# Patient Record
Sex: Male | Born: 1935 | Race: White | Hispanic: No | Marital: Married | Smoking: Former smoker
Health system: Southern US, Community
[De-identification: ages and names within clinical notes are randomized; demographics above are authoritative.]

## PROBLEM LIST (undated history)

## (undated) DIAGNOSIS — I671 Cerebral aneurysm, nonruptured: Secondary | ICD-10-CM

## (undated) DIAGNOSIS — N189 Chronic kidney disease, unspecified: Secondary | ICD-10-CM

## (undated) DIAGNOSIS — I639 Cerebral infarction, unspecified: Secondary | ICD-10-CM

## (undated) DIAGNOSIS — D649 Anemia, unspecified: Secondary | ICD-10-CM

## (undated) DIAGNOSIS — E785 Hyperlipidemia, unspecified: Secondary | ICD-10-CM

## (undated) DIAGNOSIS — J189 Pneumonia, unspecified organism: Secondary | ICD-10-CM

## (undated) DIAGNOSIS — I714 Abdominal aortic aneurysm, without rupture: Secondary | ICD-10-CM

## (undated) DIAGNOSIS — J449 Chronic obstructive pulmonary disease, unspecified: Secondary | ICD-10-CM

## (undated) DIAGNOSIS — I10 Essential (primary) hypertension: Secondary | ICD-10-CM

## (undated) HISTORY — DX: Cerebral infarction, unspecified: I63.9

## (undated) HISTORY — DX: Chronic obstructive pulmonary disease, unspecified: J44.9

## (undated) HISTORY — DX: Pneumonia, unspecified organism: J18.9

## (undated) HISTORY — DX: Chronic kidney disease, unspecified: N18.9

## (undated) HISTORY — DX: Anemia, unspecified: D64.9

## (undated) HISTORY — DX: Abdominal aortic aneurysm, without rupture: I71.4

## (undated) HISTORY — DX: Essential (primary) hypertension: I10

## (undated) HISTORY — DX: Hyperlipidemia, unspecified: E78.5

---

## 1970-05-21 DIAGNOSIS — I639 Cerebral infarction, unspecified: Secondary | ICD-10-CM

## 1970-05-21 HISTORY — PX: CARDIAC CATHETERIZATION: SHX172

## 1970-05-21 HISTORY — DX: Cerebral infarction, unspecified: I63.9

## 2001-03-13 ENCOUNTER — Ambulatory Visit (HOSPITAL_COMMUNITY): Admission: RE | Admit: 2001-03-13 | Discharge: 2001-03-13 | Payer: Self-pay | Admitting: Pulmonary Disease

## 2001-04-10 ENCOUNTER — Ambulatory Visit (HOSPITAL_COMMUNITY): Admission: RE | Admit: 2001-04-10 | Discharge: 2001-04-10 | Payer: Self-pay | Admitting: Cardiology

## 2003-08-19 ENCOUNTER — Ambulatory Visit (HOSPITAL_COMMUNITY): Admission: RE | Admit: 2003-08-19 | Discharge: 2003-08-19 | Payer: Self-pay | Admitting: Family Medicine

## 2005-10-12 ENCOUNTER — Ambulatory Visit: Payer: Self-pay | Admitting: Internal Medicine

## 2005-10-23 ENCOUNTER — Ambulatory Visit: Payer: Self-pay | Admitting: Internal Medicine

## 2005-10-23 ENCOUNTER — Ambulatory Visit (HOSPITAL_COMMUNITY): Admission: RE | Admit: 2005-10-23 | Discharge: 2005-10-23 | Payer: Self-pay | Admitting: Internal Medicine

## 2005-12-13 ENCOUNTER — Inpatient Hospital Stay (HOSPITAL_COMMUNITY): Admission: EM | Admit: 2005-12-13 | Discharge: 2005-12-20 | Payer: Self-pay | Admitting: Emergency Medicine

## 2005-12-28 ENCOUNTER — Ambulatory Visit (HOSPITAL_COMMUNITY): Admission: RE | Admit: 2005-12-28 | Discharge: 2005-12-28 | Payer: Self-pay | Admitting: Family Medicine

## 2006-07-26 ENCOUNTER — Ambulatory Visit (HOSPITAL_COMMUNITY): Admission: RE | Admit: 2006-07-26 | Discharge: 2006-07-26 | Payer: Self-pay | Admitting: Family Medicine

## 2007-02-11 ENCOUNTER — Ambulatory Visit: Payer: Self-pay | Admitting: Vascular Surgery

## 2007-02-11 DIAGNOSIS — I714 Abdominal aortic aneurysm, without rupture, unspecified: Secondary | ICD-10-CM

## 2007-02-11 HISTORY — DX: Abdominal aortic aneurysm, without rupture, unspecified: I71.40

## 2007-02-11 HISTORY — DX: Abdominal aortic aneurysm, without rupture: I71.4

## 2008-02-16 ENCOUNTER — Ambulatory Visit: Payer: Self-pay | Admitting: Vascular Surgery

## 2008-08-17 ENCOUNTER — Ambulatory Visit: Payer: Self-pay | Admitting: Vascular Surgery

## 2009-01-25 ENCOUNTER — Ambulatory Visit: Payer: Self-pay | Admitting: Vascular Surgery

## 2009-08-02 ENCOUNTER — Ambulatory Visit: Payer: Self-pay | Admitting: Vascular Surgery

## 2010-02-07 ENCOUNTER — Encounter: Payer: Self-pay | Admitting: Internal Medicine

## 2010-02-28 ENCOUNTER — Ambulatory Visit: Payer: Self-pay | Admitting: Vascular Surgery

## 2010-10-03 NOTE — Procedures (Signed)
DUPLEX ULTRASOUND OF ABDOMINAL AORTA   INDICATION:  Follow up abdominal aortic aneurysm.   HISTORY:  Diabetes:  Yes.  Cardiac:  No.  Hypertension:  Yes.  Smoking:  Cigars intermittently.  Connective Tissue Disorder:  Family History:  No.  Previous Surgery:  No.   DUPLEX EXAM:         AP (cm)                   TRANSVERSE (cm)  Proximal             2.54 Cm                   2.90 cm  Mid                  4.35 cm                   4.24 cm  Distal               3.54 cm  Right Iliac          1.33 cm  Left Iliac           1.37 cm   PREVIOUS:  Date: 02/11/07  AP:  4.13  TRANSVERSE:  4.1   IMPRESSION:  1. Stable abdominal aortic aneurysm with largest measurement of 4.35      cm X 4.24 cm.  2. Some areas suboptimally imaged due to body habitus, bowel gas, and      technical limitations.   ___________________________________________  Quita Skye Hart [place], M.D.   AS/MEDQ  D:  02/16/2008  T:  02/16/2008  Job:  [id number]

## 2010-10-03 NOTE — Procedures (Signed)
DUPLEX ULTRASOUND OF ABDOMINAL AORTA   INDICATION:  Follow-up evaluation of known abdominal aortic aneurysm.   HISTORY:  Diabetes:  Yes  Cardiac:  No  Hypertension:  Yes  Smoking:  The patient smokes cigars  Connective Tissue Disorder:  Family History:  No  Previous Surgery:  No   DUPLEX EXAM:         AP (cm)                   TRANSVERSE (cm)  Proximal             2.0 cm                    2.4 cm  Mid                  4.4 cm                    4.4 cm  Distal               3.3 cm                    3.4 cm  Right Iliac          1.0 cm                    1.3 cm  Left Iliac           1.1 cm                    1.3 cm   PREVIOUS:  Date: February 16, 2008  AP:  4.4  TRANSVERSE:  4.2   IMPRESSION:  Abdominal aortic aneurysm.  Measurements are stable  compared to previous studies.   ___________________________________________  Quita Skye Hart [place], M.D.   MC/MEDQ  D:  08/17/2008  T:  08/17/2008  Job:  [id number]

## 2010-10-03 NOTE — Procedures (Signed)
DUPLEX ULTRASOUND OF ABDOMINAL AORTA   INDICATION:  Followup of abdominal aortic aneurysm.   HISTORY:  Diabetes:  Yes.  Cardiac:  No.  Hypertension:  Yes.  Smoking:  Yes.  Connective Tissue Disorder:  Family History:  No.  Previous Surgery:  No.   DUPLEX EXAM:         AP (cm)                   TRANSVERSE (cm)  Proximal             4.27 cm                   4.04 cm  Mid                  2.2 cm                    2.05 cm  Distal               1.72 cm                   2.3 cm  Right Iliac          0.94 cm                   1.15 cm  Left Iliac           0.63 cm                   1.33 cm   PREVIOUS:  Date: 01/25/09  AP:  4.3  TRANSVERSE:  4.3   IMPRESSION:  1. Abdominal aortic aneurysm, the largest measurement measuring 4.27 X      4.04 cm.  2. Poor visualization due to the patient's body habitus, bowel gas      pattern, and shadowing from gallstones.   ___________________________________________  Quita Skye Hart [place], M.D.   CJ/MEDQ  D:  08/02/2009  T:  08/02/2009  Job:  [id number]

## 2010-10-03 NOTE — Consult Note (Signed)
NEW PATIENT CONSULTATION   Dylan [name], Dylan [name]  DOB:  09/03/1930                                       02/28/2010  ZOXWR#:[id number]   Patient is a 77 year old male patient of Dr. Butch Penny with  abdominal aortic aneurysm which we have followed for several years.  He  denies any recent abdominal or back symptoms.   CHRONIC MEDICAL PROBLEMS:  1. Diabetes mellitus type 2.  2. Hypertension.  3. Hyperlipidemia.  4. History of cholelithiasis which has resolved, apparently.   SOCIAL HISTORY:  The patient is married, has 5 children, is retired.  Has not smoked in 40 years.  Does not use alcohol.   FAMILY HISTORY:  Positive for coronary artery disease in his brother.  Negative for diabetes and stroke.   REVIEW OF SYSTEMS:  Positive for changes in his vision, some leg  discomfort with walking, although he is able to ambulate up to 1 mile.  Diffuse arthritis.  Denies all other symptoms in review of systems.   PHYSICAL EXAMINATION:  Blood pressure 181/88, heart rate 71,  respirations 14.  Generally, he is an obese, well-developed and well-  nourished male who is in no apparent distress, alert and oriented x3.  HEENT:  Normal for age.  He is edentulous.  EOMs intact.  Lungs:  Clear  to auscultation.  No rhonchi or wheezing.  Cardiovascular:  Regular  rhythm.  No murmurs.  Carotid pulses are 3+.  No audible bruits.  Abdomen:  Obese.  No palpable masses are noted.  Musculoskeletal:  Free  of major deformities.  Neurologic:  Normal.  Skin is free of rashes.  Lower extremity exam reveals 3+ femoral, popliteal, and posterior tibial  pulses palpable bilaterally.  No edema.   Today I ordered a duplex scan of his abdominal aorta for continued  follow-up.  His aneurysm remains in the 4.2 to 4.3 cm range in maximum  diameter, which has not changed significantly in the last 4 years.   I discussed these findings with him and the fact that we do need to  watch this on an annual  basis to look for silent enlargements.  We will  see him in 1 year with a repeat duplex scan of his aorta.  If he should  develop any severe back, abdominal, or flank symptoms, he will report to  the emergency room.     Quita Skye Hart [place], M.D.  Electronically Signed   JDL/MEDQ  D:  02/28/2010  T:  03/01/2010  Job:  [id number]

## 2010-10-03 NOTE — Procedures (Signed)
DUPLEX ULTRASOUND OF ABDOMINAL AORTA   INDICATION:  Follow up of abdominal aortic aneurysm.   HISTORY:  Diabetes:  Yes.  Cardiac:  No.  Hypertension:  Yes.  Smoking:  Yes.  Connective Tissue Disorder:  Family History:  No.  Previous Surgery:  No.   DUPLEX EXAM:         AP (cm)                   TRANSVERSE (cm)  Proximal             2.6 Cm                    2.0 cm  Mid                  4.2 cm                    4.1 Cm  Distal               3.8 cm                    3.7 Cm  Right Iliac          1.7 cm                    1.7 Cm  Left Iliac           2.3 cm                    1.4 Cm   PREVIOUS:  Date:  AP:  4.3  TRANSVERSE:  4.3 cm   IMPRESSION:  1. Stable abdominal aortic aneurysm within the mid aorta measuring 4.2      x 4.1 cm, unchanged from previous ultrasound dated 08/02/2009.  2. Technically study due to the patient's body habitus.   ___________________________________________  Quita Skye Hart [place], M.D.   OD/MEDQ  D:  02/28/2010  T:  02/28/2010  Job:  [id number]

## 2010-10-03 NOTE — Procedures (Signed)
DUPLEX ULTRASOUND OF ABDOMINAL AORTA   INDICATION:  Follow up abdominal aortic aneurysm.   HISTORY:  Diabetes:  Yes.  Cardiac:  No.  Hypertension:  Yes.  Smoking:  Cigars.  Connective Tissue Disorder:  Family History:  No.  Previous Surgery:  No.   DUPLEX EXAM:         AP (cm)                   TRANSVERSE (cm)  Proximal             2.26 Cm                   2.27 cm  Mid                  4.13 cm                   4.1 Cm  Distal               1.68 cm                   1.85 cm  Right Iliac          1.22 cm                   1.33 cm  Left Iliac           1.19 cm                   1.37 cm   PREVIOUS:  Date: 2000, by CT  AP:  4.1  TRANSVERSE:   IMPRESSION:  Stable abdominal aortic aneurysm measurements from previous  CT.   ___________________________________________  Quita Skye. Hart [place], M.D.   AS/MEDQ  D:  02/11/2007  T:  02/11/2007  Job:  [id number]

## 2010-10-03 NOTE — Procedures (Signed)
DUPLEX ULTRASOUND OF ABDOMINAL AORTA   INDICATION:  Abdominal aortic aneurysm.   HISTORY:  Diabetes:  Yes.  Cardiac:  No.  Hypertension:  Yes.  Smoking:  Yes.  Connective Tissue Disorder:  Family History:  No.  Previous Surgery:  No.   DUPLEX EXAM:         AP (cm)                   TRANSVERSE (cm)  Proximal             2.6 cm                    2.7 cm  Mid                  4.3 cm                    4.3 cm  Distal               not visualized            not visualized  Right Iliac          not visualized            not visualized  Left Iliac           not visualized            not visualized   PREVIOUS:  Date:  AP:  TRANSVERSE:   IMPRESSION:  1. Stable measurements of the abdominal aortic aneurysm noted when      compared to the previous exam, as described above.  2. Limited visualization of the abdominal aorta noted due to overlying      bowel gas patterns.   ___________________________________________  Quita Skye Hart [place], M.D.   CH/MEDQ  D:  01/25/2009  T:  01/25/2009  Job:  [id number]

## 2010-10-06 NOTE — Group Therapy Note (Signed)
Dylan [name], [name]                ACCOUNT NO.:  [id number]   MEDICAL RECORD NO.:  [id number]          PATIENT TYPE:  INP   LOCATION:  A314                          FACILITY:  [facility]   PHYSICIAN:  Mila Homer. Sudie Bailey, M.D.DATE OF BIRTH:  [birth year]   DATE OF PROCEDURE:  12/16/2005  DATE OF DISCHARGE:                                   PROGRESS NOTE   SUBJECTIVE:  The patient was admitted with pneumonia. He says he feels a  good deal better.   OBJECTIVE:  VITAL SIGNS:  Temperature 98.6, pulse 73, respiratory rate 18,  blood pressure 133/64.  GENERAL:  He is sitting up in bed, eating lunch. He is oriented, alert, well-  developed, and obese.  SKIN:  His color is good.  LUNGS:  Show decreased breath sounds throughout but he is moving air well.  There are no intercostal retractions. No use of accessory muscles on  respiration.  HEART:  Regular rhythm with rate of about 70.  ABDOMEN:  Soft without hepatosplenomegaly, mass, or tenderness.  EXTREMITIES:  There is no edema in the ankles.   ASSESSMENT:  1. Lingular pneumonia.  2. Dehydration.  3. Type 2 diabetes.  4. Hypercholesterolemia.  5. Central hypertension.   PLAN:  Continue IV Azithromycin and Rocephin. Continue Metformin 1,000 mg  b.i.d., Glipizide 10 mg b.i.d., and NovoLog insulin t.i.d. for his diabetes.  Continue the Losartan 50 mg daily and HCTZ 12.5 mg daily for hypertension  and renal protection. Continue ASA 81 mg daily with Atorvastatin 40 mg daily  and Pantoprazole 40 mg daily. Chest x-ray results from today are pending.      Mila Homer. Sudie Bailey, M.D.  Electronically Signed     SDK/MEDQ  D:  12/16/2005  T:  12/16/2005  Job:  [id number]

## 2010-10-06 NOTE — Group Therapy Note (Signed)
NAME:  Dylan [name] NO.:  [id number]   MEDICAL RECORD NO.:  [id number]          PATIENT TYPE:  INP   LOCATION:  A314                          FACILITY:  [facility]   PHYSICIAN:  Catalina Pizza, M.D.        DATE OF BIRTH:  01-18-1931   DATE OF PROCEDURE:  DATE OF DISCHARGE:  12/15/2005                                   PROGRESS NOTE   SUBJECTIVE:  Dylan [name] is a 77 year old white gentleman, who has diabetes  mellitus, who presented with fever and worsening confusion felt secondary to  a pneumonia seen on chest x-ray.  He was started on Rocephin and Zithromax  IV and continued having some confusion initially but per his family today,  states that he has been much more normal as far as his mentation.  He denies  any specific chest pains or shortness of breath.  No other difficulties  other than the food that he is given.   OBJECTIVE:  VITAL SIGNS:  T-max was 101.3 last evening at 8:00 but has been  98.3 through today.  Blood pressure 130/89, pulse 65, respirations 20.  CBGs  have been 94, 102, and 167, respectively.  He is saturating 95% on room air.  GENERAL:  This is an elderly male sitting in chair in no acute distress.  HEENT:  Unremarkable.  LUNGS:  Good air movement throughout.  No rhonchi or wheezing, and no  consolidation appreciated.  HEART:  Regular rate and rhythm.  No murmurs, gallops, or rubs.  ABDOMEN:  Soft, nondistended, and positive bowel sounds.  EXTREMITIES:  No lower extremity edema.  NEUROLOGIC:  Alert and oriented x3, much more appropriate, and responding to  questions appropriately.   Blood cultures x2 to date are negative.  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   IMPRESSION:  This is a 77 year old gentleman admitted for pneumonia and  altered mental status.   ASSESSMENT/PLAN:  1.  Bronchial pneumonia with left lingular lobe infiltrate.  We will      continue with the IV Rocephin and Zithromax at this time since he was      febrile last evening and continue to monitor.  We will repeat PA and      lateral chest x-ray in the morning to see if any change in this      infiltrate.  2.  Mild dehydration.  The patient has been repleted with fluid and is doing      well from that standpoint.  He is eating and drinking normally at this      time.  3.  Altered mental status/delirium.  This may have been secondary to the      infection.  He does have history of old infarcts and unclear exact      baseline but appears to be more appropriate today and family has not      noticed any  abnormalities hallucinations or behavior.  4.  Diabetes mellitus, type 2.  The patient is on NovoLog sliding-scale at      this time.  His Glucophage is continued twice a day, and his Glucotrol      was supposedly on hold, unclear if he has received it today.  This is on      hold secondary to he was having low blood sugars with decreased      appetite.  As this increases, may want to resume this medicine.  5.  Gastrointestinal and deep venous thrombosis prophylaxis with Lovenox and      Protonix.   DISPOSITION:  We will continue with the intravenous antibiotics for at least  another 1-2 days.  If the patient is better, may be able to be discharged on  Monday or Tuesday.      Catalina Pizza, M.D.  Electronically Signed     ZH/MEDQ  D:  12/15/2005  T:  12/15/2005  Job:  [id number]

## 2010-10-06 NOTE — Op Note (Signed)
NAME:  Dylan [name], Dylan [name]                ACCOUNT NO.:  [id number]   MEDICAL RECORD NO.:  [id number]          PATIENT TYPE:  AMB   LOCATION:  DAY                           FACILITY:  [facility]   PHYSICIAN:  Lionel December, M.D.    DATE OF BIRTH:  [birth year]   DATE OF PROCEDURE:  10/23/2005  DATE OF DISCHARGE:                                 OPERATIVE REPORT   PROCEDURE:  Esophagogastroduodenoscopy followed by colonoscopy.   INDICATION:  Mr. Hoffmann is 77 year old Caucasian male who recently  presented to Dr. Renard Matter with history of melena and was noted to be anemic  with a hemoglobin of 10.5.  By the time he was seen in the office, his  melena had resolved and his stool was guaiac negative.  His hemoglobin has  come up from 10.5 to 12.2 ten days ago.  He denies abdominal pain or rectal  bleeding but has nausea and early satiety, but has not lost any weight.  He  is on PPI for GERD.  He is undergoing diagnostic EGD and colonoscopy.  The  procedure risks were reviewed with the patient, informed consent was  obtained.   MEDS FOR CONSCIOUS SEDATION:  Benzocaine spray pharyngeal topical  anesthesia, Demerol 50 mg IV, Versed 7 mg IV.   FINDINGS:  The procedure was performed in the endoscopy suite.  The  patient's vital signs and O2 sat were monitored during the procedure and  remained stable.   Procedure 1:  Esophagogastroduodenoscopy.  The patient was placed in the  left lateral position.  The Olympus videoscope was passed via oropharynx  without any difficulty into esophagus.   Esophagus:  The mucosa of the esophagus was normal.  The GE junction was  wavy with a tiny island of ectopic gastric mucosa proximal to that.  The GE  junction was at 40 cm from the incisors.   Stomach:  It was empty and distended very well with insufflation.  The folds  of the proximal stomach were normal.  Examination of the mucosa was normal  at the body and antrum but there was prepyloric erythema with edema,  friable  mucosa but no erosions or ulcers were noted.  The pyloric channel was  patent.  The angularis, fundus, and cardia were examined by retroflexion of  the scope and were normal.   Duodenum:  The bulbar mucosa was normal.  The scope was passed to the second  part of the duodenum where mucosa and folds were normal.  The endoscope was  withdrawn.  The patient was prepared for procedure #2.   Procedure 2:  Colonoscopy.  Rectal examination was performed.  No  abnormality was noted on external or digital exam.  The Olympus videoscope  was placed in the rectum and advanced under vision into the sigmoid colon  and beyond.  The preparation was satisfactory.  There were scattered  diverticula in the sigmoid colon.  The scope was passed into the cecum which  was identified by ileocecal valve and appendiceal orifice.  Pictures were  taken for the record.  As the scope was withdrawn, the colonic  mucosa was  once again carefully examined.  There were no polyps, tumor, mass, or  angiodysplasia.  Rectal mucosa was normal.  The scope was retroflexed to  examine anorectal junction and small hemorrhoids were noted below the  dentate line.  The endoscope was straightened and withdrawn.  The patient  tolerated the procedures well.   FINAL DIAGNOSIS:  1.  Pre-pyloric gastritis, no evidence of peptic ulcer disease.  2.  Sigmoid colon diverticulosis which is mild.  3.  External hemorrhoids.   I suspect he may have bled from his upper or mid GI tract or even a  diverticular bleed.   RECOMMENDATIONS:  1.  H. pylori serology will be checked today.  2.  Should he experience further episodes of overt bleeding will consider      Given capsule study.  3.  Will check his H&H.  4.  High fiber diet with fiber supplement 3-4 grams per day.  5.  I will be contacting the patient with results of the above test.      Lionel December, M.D.  Electronically Signed     NR/MEDQ  D:  10/23/2005  T:  10/23/2005   Job:  [id number]   cc:   Angus G. Renard Matter, MD  Fax: [phone]

## 2010-10-06 NOTE — Group Therapy Note (Signed)
NAME:  Dylan [name], Dylan [name]                ACCOUNT NO.:  [id number]   MEDICAL RECORD NO.:  [id number]          PATIENT TYPE:  INP   LOCATION:  A314                          FACILITY:  [facility]   PHYSICIAN:  Angus G. Renard Matter, MD   DATE OF BIRTH:  09/04/1930   DATE OF PROCEDURE:  DATE OF DISCHARGE:                                   PROGRESS NOTE   This patient was admitted with what was felt to be bronchial pneumonia and  mild dehydration.  The patient does have a history of diabetes.  He  currently is on IV Rocephin and Zithromax, sliding scale insulin coverage  with Novolog.   OBJECTIVE:  VITAL SIGNS:  Blood pressure 132/70, respiratory rate 24, pulse  88, temperature 97.8.  LUNGS:  Clear.  HEART:  Regular rhythm.  ABDOMEN:  No palpable organs or masses.   ASSESSMENT:  Patient admitted with what appears to be bronchial pneumonia.  X-ray shows left lingular lobe infiltrate.  He does have diabetes,  hypertension, dyslipidemia.   PLAN:  Continue current intravenous antibiotics.  Continue regimen.      Angus G. Renard Matter, MD  Electronically Signed     AGM/MEDQ  D:  12/14/2005  T:  12/14/2005  Job:  [id number]

## 2010-10-06 NOTE — Group Therapy Note (Signed)
NAME:  Dylan [name], Dylan [name]                ACCOUNT NO.:  [id number]   MEDICAL RECORD NO.:  [id number]          PATIENT TYPE:  INP   LOCATION:  A314                          FACILITY:  [facility]   PHYSICIAN:  Angus G. Renard Matter, MD   DATE OF BIRTH:  12/25/1930   DATE OF PROCEDURE:  12/19/2005  DATE OF DISCHARGE:                                   PROGRESS NOTE   HISTORY OF PRESENT ILLNESS:  This patient is admitted with bronchial  pneumonia.  He does have diabetes which is fairly well controlled.  He does  have left upper lobe and left lower lobe pneumonia and ..  abdominal aortic  aneurysm which measures 4.1 cm in diameter by CT and less than 4 by  ultrasound.  Also has evidence of cholelithiasis.   OBJECTIVE:  VITAL SIGNS:  Blood pressure 122/54, respirations 20, pulse 98,  temperature 98.1.  The patient does have elevated BNP 407.  HEART:  Regular rhythm.  LUNGS:  Diminished breath sounds.  ABDOMEN:  No palpable organs or masses.   ASSESSMENT:  The patient was admitted with bronchial pneumonia.  The patient  was seen in consultation by Dr. Juanetta Gosling, pulmonologist, who suggested  continue use of antibiotics, continue current regimen.      Angus G. Renard Matter, MD  Electronically Signed     AGM/MEDQ  D:  12/19/2005  T:  12/19/2005  Job:  [id number]

## 2010-10-06 NOTE — Group Therapy Note (Signed)
NAME:  Dylan [name], Dylan [name]                ACCOUNT NO.:  [id number]   MEDICAL RECORD NO.:  [id number]          PATIENT TYPE:  INP   LOCATION:  A314                          FACILITY:  [facility]   PHYSICIAN:  Angus G. Renard Matter, MD   DATE OF BIRTH:  02/09/1931   DATE OF PROCEDURE:  12/17/2005  DATE OF DISCHARGE:                                   PROGRESS NOTE   SUBJECTIVE:  This patient was admitted with pneumonia. He does have ongoing  diabetes as well, which is fairly well controlled. A repeat chest x-ray  shows cavitating process in the left lung, which simply may be related to  focal area of pneumonia. Underlying cavitating mass is not excluded.  Radiology recommended CT of chest.   OBJECTIVE:  VITAL SIGNS:  Blood pressure 146/69, respiratory rate 20, pulse  80, temperature 99. Blood sugars have ranged from 139 to 195.  LUNGS:  Clear.  HEART:  Regular rhythm.  ABDOMEN:  No palpable organs or masses.   ASSESSMENT:  The patient does have cavitating process on left lung.   PLAN:  Obtain CT of chest today. CBC. Continue current IV antibiotic.  Rocephin and Zithromax.      Angus G. Renard Matter, MD  Electronically Signed     AGM/MEDQ  D:  12/17/2005  T:  12/17/2005  Job:  [id number]

## 2010-10-06 NOTE — H&P (Signed)
[name], [name]                ACCOUNT NO.:  [id number]   MEDICAL RECORD NO.:  [id number]          PATIENT TYPE:  INP   LOCATION:  A314                          FACILITY:  [facility]   PHYSICIAN:  Dylan Hays. Dechurch, M.D.DATE OF BIRTH:  1930-12-17   DATE OF ADMISSION:  12/13/2005  DATE OF DISCHARGE:  LH                                HISTORY & PHYSICAL   HISTORY OF PRESENT ILLNESS:  A 77 year old Caucasian male with a history of  diabetes mellitus followed by Dr. Renard [name] with a past medical history who  presented to the emergency room this evening after his family noted him to  be confused over the last 3 days.  Today, he stated he just did not feel  well, had some low grade fever and did not go to work.  He was noted to be  having some hallucinations of people in the room and ghosts chasing after  him.  He was easily reoriented and actually yesterday was normal.  This  afternoon, he again was more confused and was brought to the emergency room  where a chest x-ray revealed a left lingular lobe infiltrate, though his O2  saturation on room air was 97%.  He denied any shortness of breath but had a  fever of 102 orally.  He was admitted to the hospital for further evaluation  and treatment.  He noted yesterday he had some chest tightness and that is  when he noted his temperature was 100.  He thought he could sleep it off.  He has had no PND.  No chest pain per se.  Denies any dyspnea on exertion or  change in exercise tolerance.  He states he just did not have any energy  today.  He smokes cigars, but infrequently.  His wife does smoke in the  house.  He is denies any weight loss.  No night sweats or chills.  Appetite  has been down over the last day or so.  Denies any GU symptoms.  No GI  symptoms.  He has had no headache or visual changes, no weakness other than  general malaise.   PAST HISTORY:  1. Diabetes mellitus.  2. Hypertension.  3. Hyperlipidemia.  4. History of gout.  5.  Arthritis.  6. Apparently has had a cardiac catheterization, though no records are      available.   FAMILY HISTORY:  Pertinent for stomach and breast cancer in his mother, and  sister with breast cancer.   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  He will drink brandy at night, although has not had any in  the last 3 or 4 nights and can go weeks at a time without any.  He smokes  cigars, as noted above.  He has 5 children living and well.   MEDICATIONS:  1. Hyzaar 50/12.5.  2. Crestor 20 daily.  3. Aspirin 81 daily.  4. Allopurinol 100 mg as needed.  5. Glipizide/metformin 5/500 two b.i.d.  6. Actos 30 daily.  7. Protonix 40  daily.  8. Fish oil.  9. Pepcid p.r.n.   ALLERGIES:  He has no known drug allergies.   PHYSICAL EXAMINATION:  GENERAL:  Physical exam reveals a well-developed,  well-nourished gentleman, alert and appropriate, very pleasant.  He states  he feels better, in no distress, lying near supine.  VITAL SIGNS:  Blood pressure is 147/74, pulse of 84 and regular,  respirations initially were 24 with a temperature of 99.  O2 saturation is  98% on room air.  HEENT/NECK:  Supple.  No JVD or adenopathy.  Oropharynx - mucosa is slightly  dry.  No lesions are noted.  LUNGS:  His lungs are diminished but clear to auscultation.  I cannot  appreciate any rales or rhonchi.  HEART:  Regular.  There is no gallop or murmur.  ABDOMEN:  Protuberant, soft and nontender.  Cannot appreciate any masses.  EXTREMITIES:  Without clubbing or cyanosis.  There is no edema.  He has no  skin rash or lesions.  Neurologically, he is alert and oriented to person,  place, date and time.  He can follow simple and complex commands.  He has  intact strength throughout.  Pupils are reactive.  Extraocular muscles  intact.  Gag intact.  Speech is fluent and normal.   ASSESSMENT/PLAN:  Delirium in the setting of a left lingular infiltrate on x-   ray with fever, likely due to infection.  Doubt primary central nervous  system event.  He has had some chest pain, and radiology notes the lingular  consolidation could be seen in post pulmonary embolus, but he has had  nothing else to suggest nor any risk factors at this point.  However, we  will obtain a D-dimer.  He does not have a leukocytosis or left shift.  His  labs otherwise are unremarkable.  He did have bacteriuria and many casts in  his urine consistent with squamous cells, probably a contaminated specimen.  He has a mild anemia, but better than it was previously.  BUN is 16,  creatinine 1.3, unknown renal insufficiency.  He probably is somewhat volume  depleted based on a sodium of 130.  The plan was discussed with the patient  and his wife who seemed to have reasonable understanding.  He will be  followed by Dr. Renard [name] during his hospital stay.      Dylan [name], M.D.  Electronically Signed     FED/MEDQ  D:  12/13/2005  T:  12/14/2005  Job:  [id number]

## 2010-10-06 NOTE — Consult Note (Signed)
[name], Dylan [name]                ACCOUNT NO.:  [id number]   MEDICAL RECORD NO.:  [id number]          PATIENT TYPE:  INP   LOCATION:  A314                          FACILITY:  [facility]   PHYSICIAN:  Edward L. Juanetta Gosling, M.D.DATE OF BIRTH:  1930/06/10   DATE OF CONSULTATION:  DATE OF DISCHARGE:                                   CONSULTATION   REASON FOR CONSULTATION:  Pneumonia.   HISTORY:  Mr. Dylan [name] is a 77 year old who has had pneumonia and was  admitted to the hospital on December 13, 2005.  He said that he was congested.  His family had noted that he was confused.  He had low-grade fever, came to  the emergency room, and was found to have a left lower lobe lingular  infiltrate.  He had a temperature of 102, said he was not short of breath  but now he says he is.  He had had some chest tightness through the days  prior to his admission.  Apparently, he had some hallucinations prior to  admission also but he has no memory of that.  He says that he smokes a cigar  about once a week.  His wife smokes cigarettes in the house.  He has not had  any other symptoms of anything as far as he knows.  He has had some general  malaise and some weakness.   PAST MEDICAL HISTORY:  Is listed as being positive for diabetes,  hypertension, hyperlipidemia, gout, arthritis and a previous cardiac  catheterization, but he only says that he has had diabetes.  I asked him  specifically if he has had hypertension and he said no.   FAMILY HISTORY:  His mother died of cancer of the breast and stomach.  He  has a sister with breast cancer.  No known history of any lung disease in  the family.  No personal history of asthma, emphysema, etc.   SOCIAL HISTORY:  He drinks about one or two brandies a night.  He smokes  cigars as mentioned.  He has worked Games developer.   MEDICATIONS ON ADMISSION:  1.  Hyzaar 50/12.5.  2.  Crestor 20 mg daily.  3.  Aspirin 81 mg daily.  4.  Allopurinol 100 mg.  5.  Actos 30 mg  daily.  6.  Glipizide/metformin 5/500 two b.i.d.  7.  Protonix 40 mg daily.  8.  Fish induction of labor.  9.  Pepcid.   He does not have any drug allergies.   REVIEW OF SYSTEMS:  Except as mentioned is negative.   PHYSICAL EXAMINATION:  GENERAL:  Shows a well-developed, well-nourished,  alert male who is in no acute distress.  VITAL SIGNS:  Show temperature is 98.3, pulse 74, respirations 20, blood  sugar 162, blood pressure 138/80, O2 saturation is 92% on room air.  HEENT:  His pupils are equal, round, react to light and accommodation.  His  nose and throat are clear.  NECK:  Supple.  He does not have any lymphadenopathy.  CHEST:  Shows some wheezes on the left more than on the right.  HEART:  Regular without gallop.  ABDOMEN:  Soft without masses.  He does not have any palpable organs.  EXTREMITIES:  Showed no edema.  CENTRAL NERVOUS SYSTEM:  Grossly intact.   LABORATORY WORK:  His white count was 6200 yesterday, hemoglobin 10.1.  His  urine culture no growth.  Blood cultures are negative.  His initial CBC  showed his white count was 7000, hemoglobin then was 11.6.  His BMET showed  sodium 134, glucose was 204..  Chest x-ray on admission showed lingular  consolidation. 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  Repeat chest x-ray on July 29 showed possible  cavitating process so CT chest was done.  CT chest showed infiltrate left  upper lobe extending into the lingula without cavitation, scattered  nodularity which are somewhat diffuse, minimal right lower lobe infiltrate  and atelectasis, bibasilar effusions, cholelithiasis, and also noted was an  abdominal aortic aneurysm.  He then had an ultrasound which showed that he  did have stones in the gallbladder and the aneurysm was smaller than noted  on the CT.   ASSESSMENT:  He has multilobar pneumonia which  caused him to become  confused.  He has not had a great deal of white blood count elevation with  this but he did have fever to 102.  I suspect his confusion is related to  the white matter changes seen on CT, diffuse cerebral atrophy, etc.  Considering all that, I think we should continue with his current treatments  and medications and I would not change anything.  It is possible that he has  aspirated but his white count is still okay.  He looks clinically like he is  feeling better and I would simply follow him for now and then plan to follow  this chest x-ray until it is totally clear.   Thank you very much for allowing me to see him with you.      Edward L. Juanetta Gosling, M.D.  Electronically Signed     ELH/MEDQ  D:  12/18/2005  T:  12/18/2005  Job:  [id number]

## 2010-10-06 NOTE — Group Therapy Note (Signed)
NAMEGRANTHAM, HIPPERT                ACCOUNT NO.:  [id number]   MEDICAL RECORD NO.:  [id number]          PATIENT TYPE:  INP   LOCATION:  A314                          FACILITY:  [facility]   PHYSICIAN:  Edward L. Juanetta Gosling, M.D.DATE OF BIRTH:  04-20-1931   DATE OF PROCEDURE:  12/19/2005  DATE OF DISCHARGE:                                   PROGRESS NOTE   Patient of Dr. Renard Matter.   PROBLEM:  Pneumonia.   SUBJECTIVE:  Mr. Lukas says he is doing okay and feels a little better.  Dr. Renard Matter apparently said that he may be able to go home tomorrow.  His  examination today shows his temperature is 98.9, pulse 72, respirations 20,  blood sugar is 157, blood pressure 143/71, O2 saturations 92% on 2 L.  His  chest is fairly clear.  He looks better.   ASSESSMENT:  I think he is doing better in general.  I think it would be  okay to let him go home.  His chest x-ray will not clear for at least six  weeks and it will probably take him approximately that long to feel better.      Edward L. Juanetta Gosling, M.D.  Electronically Signed     ELH/MEDQ  D:  12/19/2005  T:  12/19/2005  Job:  [id number]

## 2010-10-06 NOTE — Consult Note (Signed)
Dylan [name], Dylan [name]                ACCOUNT NO.:  [id number]   MEDICAL RECORD NO.:  [id number]           PATIENT TYPE:  AMB   LOCATION:                                FACILITY:  [facility]   PHYSICIAN:  Lionel December, M.D.    DATE OF BIRTH:  07/06/1930   DATE OF CONSULTATION:  10/12/2005  DATE OF DISCHARGE:                                   CONSULTATION   CHIEF COMPLAINT:  Dark stool, anemia.   HISTORY OF PRESENT ILLNESS:  Tiburcio is a 77 year old Caucasian gentleman,  patient of Dr. Renard Matter, who presents for further evaluation of anemia and a  single episode of dark stool. Several weeks ago he developed acute onset  nausea, vomiting, or diarrhea for three days. He notes his stools were very  dark brown, but denies any black stools. He did not take any Pepto-Bismol.  Since then his stools have returned to normal and he denies any further  vomiting. He saw Dr. Renard Matter during that time and had a rectal exam that  revealed stool to be hemoccult negative. He reports three take home  hemoccult cards also negative. However, he does have chronic anorexia and  early satiety. He denies any weight loss. His wife was concerned because she  noted a decrease appetite over several months duration. He denies any  chronic heartburn. He does hae intermittent heartburn related to certain  foods. He denies any abdominal pain or fresh blood per rectum. He has never  had EGD or colonoscopy.   CURRENT MEDICATIONS:  1.  Glipizide/Metformin 5/500 mg daily.  2.  Actos 30 mg daily.  3.  Crestor 20 mg daily.  4.  Hyzaar 50/12.5 mg daily.  5.  Protonix 40 mg daily.  6.  She rarely takes allopurinol 100 mg p.r.n.  7.  Fish oil 1000 mg two daily.  8.  Aspirin 81 mg daily.  9.  Pepcid-AC p.r.n.   ALLERGIES:  No known drug allergies.   PAST MEDICAL HISTORY:  1.  Diabetes mellitus.  2.  Hypertension.  3.  Hyperlipidemia.  4.  Gout.  5.  Arthritis.  6.  History of cardiac catheterization which he reports to be  unremarkable.   FAMILY HISTORY:  Mother had stomach cancer and breast cancer. No family  history of colorectal cancer. Also had a sister treated for breast cancer.   SOCIAL HISTORY:  He is married and has five living children. 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 He occasionally smokes a  cigar. He drinks brandy nightly, usually about a 5th per week.   REVIEW OF SYSTEMS:  See HPI for GI. CARDIOPULMONARY:  Denies chest pain or  shortness of breath.   PHYSICAL EXAMINATION:  VITAL SIGNS: Weight 207, height 5 feet 9 inches,  temperature 97.8, blood pressure 146/80, pulse 72.  GENERAL: A pleasant, elderly Caucasian male in no acute distress.  SKIN: Warm and dry, no jaundice.  HEENT: Conjunctiva are pink. Sclera nonicteric. Oropharyngeal mucosa moist  and pink. No lesions, erythema, or exudate. No lymphadenopathy or  thyromegaly.  CHEST: Lungs are  clear to auscultation.  CARDIAC: Regular rate and rhythm. Normal S1 and S2. No murmurs, rubs, or  gallops.  ABDOMEN: Positive bowel sounds, slightly protuberant, but symmetrical and  soft. Nontender, nondistended. No organomegaly or masses. No rebound  tenderness or guarding. No abdominal bruits or hernias.  EXTREMITIES: No edema.   LABORATORY DATA:  On August 27, 2005,  his hemoglobin was 10.5, hematocrit  33.1, MCV 95.4, platelet count 227,000.   IMPRESSION:  Mr. Nakanishi is a 77 year old gentleman who recently had nausea,  vomiting, and diarrhea, most likely gastroenteritis. There is a questionable  history of melena, although multiple Hemoccults since then have been  negative. He, however, was found to have a normocytic hemoglobin with a  hemoglobin of 10.5. He describes early satiety and anorexia. Cannot exclude  the possibility of peptic ulcer disease. He has intermittent  gastroesophageal reflux disease for which he takes Pepcid-AC for.  He has  had never had a colonoscopy.   PLAN:  I have recommended that  colonoscopy, primarily for screening purpose  as well as diagnostic EGD. I have discussed risks, alternatives, and  benefits with the patient and he is agreeable to proceed.   I would like to thank Dr. Butch Penny for allowing [place] to part in the care  of this patient.      Tana Coast, P.A.      Lionel December, M.D.  Electronically Signed    LL/MEDQ  D:  10/13/2005  T:  10/14/2005  Job:  [id number]

## 2010-10-06 NOTE — Group Therapy Note (Signed)
NAME:  Dylan [name], Dylan [name]                ACCOUNT NO.:  [id number]   MEDICAL RECORD NO.:  [id number]          PATIENT TYPE:  INP   LOCATION:  A314                          FACILITY:  [facility]   PHYSICIAN:  Angus G. Renard Matter, MD   DATE OF BIRTH:  28-Jan-1931   DATE OF PROCEDURE:  12/14/2005  DATE OF DISCHARGE:                                   PROGRESS NOTE   No dictation.      Angus G. Renard Matter, MD  Electronically Signed     AGM/MEDQ  D:  12/14/2005  T:  12/14/2005  Job:  [id number]

## 2010-10-06 NOTE — Group Therapy Note (Signed)
NAME:  Dylan [name], Dylan [name]                ACCOUNT NO.:  [id number]   MEDICAL RECORD NO.:  [id number]          PATIENT TYPE:  INP   LOCATION:  A314                          FACILITY:  [facility]   PHYSICIAN:  Angus G. Renard Matter, MD   DATE OF BIRTH:  10-Oct-1930   DATE OF PROCEDURE:  12/18/2005  DATE OF DISCHARGE:                                   PROGRESS NOTE   PROGRESS NOTE - December 18, 2005.   SUBJECTIVE:  This patient was admitted with pneumonia.  He does have  diabetes which is fairly well controlled.  A repeat CT scan of the chest  shows infiltration left upper lobe and left lower lobe presenting pneumonia.  The patient is also noted to have a 4.1 cm abdominal aortic aneurysm and  evidence of cholelithiasis.   OBJECTIVE:  VITAL SIGNS:  Blood pressure 133/75, respirations 20, pulse 76,  temperature 99.3.  Blood sugars have ranged from 126 to 172.  HEART:  Regular rhythm.  LUNGS:  Diminished breath sounds.  ABDOMEN:  No palpable organs or masses.   ASSESSMENT:  Patient was admitted with pneumonia, left upper lobe, left  lower lobe.  Patient has incidental finding of cholelithiasis, abdominal  aortic aneurysm, 4.1 cm in diameter.   PLAN:  Continue regimen.  Will obtain pulmonary consult with Dr. Juanetta Gosling.      Angus G. Renard Matter, MD  Electronically Signed     AGM/MEDQ  D:  12/18/2005  T:  12/18/2005  Job:  [id number]

## 2010-10-06 NOTE — Discharge Summary (Signed)
NAME:  Dylan [name], Dylan [name]                ACCOUNT NO.:  [id number]   MEDICAL RECORD NO.:  [id number]          PATIENT TYPE:  INP   LOCATION:  A314                          FACILITY:  [facility]   PHYSICIAN:  Angus G. Renard Matter, MD   DATE OF BIRTH:  23-Feb-1931   DATE OF ADMISSION:  12/13/2005  DATE OF DISCHARGE:  08/02/2007LH                                 DISCHARGE SUMMARY   A 77 year old white male admitted December 13, 2005, discharged December 20, 2005,  six days' hospitalization.   DIAGNOSES:  1. Bronchopneumonia.  2. Dehydration.  3. Cholelithiasis.  4. Diabetes mellitus type 2.  5. Abdominal aortic aneurysm.  6. Hypertension.  7. Hyperlipidemia.   Patient's condition stable and improved at time of his discharge.  A 75-year-  old white male with a history of diabetes presented to the emergency room  mentally confused which remained present for approximately three days.  He  was noted to be having some hallucinations.  He was evaluated by ED  physician.  Chest x-ray revealed left lingular lobe infiltrate with O2  saturations of 97%.  He denied any shortness of breath.  He had low-grade  fever.  Patient was admitted for further evaluation.   PHYSICAL EXAMINATION:  GENERAL:  Well-developed, well-nourished male.  VITAL SIGNS:  Blood pressure 147/74, respirations 24, pulse 84, temperature  99.  HEENT:  Eyes:  PERRLA.  TM negative.  Oropharynx benign.  LUNGS:  Diminished breath sounds.  HEART:  Regular rate and rhythm.  No murmurs.  ABDOMEN:  No palpable organs or masses.  EXTREMITIES:  Free of edema.   LABORATORY DATA:  Admission CBC:  WBC 8600 with hemoglobin 12.6, hematocrit  36.4, 75 neutrophils, 13 lymphocytes.  Chemistries:  Sodium 130, potassium  4.1, chloride 95, CO2 26, glucose 147, BUN 16, creatinine 1.3, calcium 9.5.  Subsequent CBC on December 14, 2005:  Sodium 134, potassium 4.2, chloride 101,  CO2 25, glucose 204, BUN 15, creatinine 1.4.  Urinalysis 3-6 wbc's, 7-10  rbc's.  Urine  culture no growth.  X-rays:  Chest x-ray showed lingula  consolidation.  CT of the head without contrast:  Mild diffuse cortical  atrophy, extensive small vessel white matter ischemic changes, old left  cerebellar infarct, old left basal ganglia infarct.  CT of the chest with  contrast:  Infiltrate of the left upper and left lower lobe.  Patient was  noted to have cholelithiasis and visualized abdominal aortic aneurysm 4.1 cm  in size.  Ultrasound of abdomen revealed the aneurysm to be 3.4 cm in  diameter.  Electrocardiogram:  Normal sinus rhythm.   HOSPITAL COURSE:  Patient was admitted, started on normal saline at 80 mL  per hour.  He was given IV Rocephin 1 g every 24 hours, Zithromax 500 mg  every 24 hours.  He was continued on allopurinol 100 mg daily, aspirin 81 mg  daily, Crestor 20 mg daily, Hyzaar 50/12.5 mg daily, Glipizide/metformin  5/500 b.i.d., oxygen at 2 L per minute.  He was also started on subcutaneous  Lovenox 40 mg daily, Protonix 40 mg daily.  Urine culture  was obtained which  did not show any organisms.  Patient had CT of chest which showed evidence  of cholelithiasis and aortic aneurysm 4.1 cm in size.  Ultrasound of the  abdomen revealed a distal aortic aneurysm to be 3.4 cm in diameter.  Patient  did show infiltrates in the left lower lobe compatible with pneumonia.  Patient showed progressive improvement throughout his hospital stay.  Remained in the hospital a total of six days and was advised to return to  the office for follow-up after discharge concerning cholelithiasis and his  abdominal aortic aneurysm.  Patient was discharged on aspirin 81 mg daily,  Lipitor 40 mg daily, Cozaar 50 mg daily, HCTZ 25 mg daily, Glucotrol 5 mg  b.i.d., Glucophage 500 mg b.i.d., Protonix 40 mg daily, Levaquin 750 mg  daily, allopurinol 100 mg daily.  Patient was symptomatically improved at  time of discharge.      Angus G. Renard Matter, MD  Electronically Signed     AGM/MEDQ   D:  12/27/2005  T:  12/27/2005  Job:  [id number]

## 2011-02-27 ENCOUNTER — Encounter (INDEPENDENT_AMBULATORY_CARE_PROVIDER_SITE_OTHER): Payer: Medicare Other | Admitting: *Deleted

## 2011-02-27 DIAGNOSIS — I714 Abdominal aortic aneurysm, without rupture: Secondary | ICD-10-CM

## 2011-03-07 ENCOUNTER — Encounter: Payer: Self-pay | Admitting: Vascular Surgery

## 2011-03-07 NOTE — Procedures (Unsigned)
DUPLEX ULTRASOUND OF ABDOMINAL AORTA  INDICATION:  Abdominal aortic aneurysm.  HISTORY: Diabetes:  Yes. Cardiac:  No. Hypertension:  Yes. Smoking:  Yes. Connective Tissue Disorder: Family History:  No. Previous Surgery:  No.  DUPLEX EXAM:         AP (cm)                   TRANSVERSE (cm) Proximal             2.7 cm                    2.6 cm Mid                  2.5 cm                    2.6 cm Distal               4.8 cm                    4.7 cm Right Iliac          Not visualized            Not visualized Left Iliac           Not visualized            Not visualized  PREVIOUS:  Date: 02/28/2010  AP:  4.2  TRANSVERSE:  4.1  IMPRESSION: 1. Aneurysmal dilatation of the distal abdominal aorta noted.  Mild     increase in maximum diameter measurements when compared to the     previous studies.  Images from the previous examination on     02/28/2010 demonstrate a maximum diameter of 4.4 cm, based on     limited visualization. 2. The bilateral common iliac arteries were not adequately visualized     due to overlying bowel gas patterns and patient body habitus.  ___________________________________________ Quita Skye. Hart Rochester, M.D.  CH/MEDQ  D:  02/28/2011  T:  02/28/2011  Job:  [id number]

## 2011-06-18 ENCOUNTER — Other Ambulatory Visit (HOSPITAL_COMMUNITY): Payer: Self-pay | Admitting: Family Medicine

## 2011-06-18 DIAGNOSIS — R51 Headache: Secondary | ICD-10-CM

## 2011-06-18 DIAGNOSIS — R410 Disorientation, unspecified: Secondary | ICD-10-CM

## 2011-06-20 ENCOUNTER — Ambulatory Visit (HOSPITAL_COMMUNITY)
Admission: RE | Admit: 2011-06-20 | Discharge: 2011-06-20 | Disposition: A | Discharge status: Home / Self Care | Payer: PRIVATE HEALTH INSURANCE | Source: Ambulatory Visit | Attending: Family Medicine | Admitting: Family Medicine

## 2011-06-20 ENCOUNTER — Other Ambulatory Visit (HOSPITAL_COMMUNITY): Payer: Self-pay | Admitting: Family Medicine

## 2011-06-20 DIAGNOSIS — R51 Headache: Secondary | ICD-10-CM

## 2011-06-20 DIAGNOSIS — R410 Disorientation, unspecified: Secondary | ICD-10-CM

## 2011-06-20 DIAGNOSIS — R748 Abnormal levels of other serum enzymes: Secondary | ICD-10-CM

## 2011-06-20 DIAGNOSIS — K8020 Calculus of gallbladder without cholecystitis without obstruction: Secondary | ICD-10-CM | POA: Insufficient documentation

## 2011-06-20 DIAGNOSIS — R42 Dizziness and giddiness: Secondary | ICD-10-CM | POA: Insufficient documentation

## 2011-06-20 DIAGNOSIS — K838 Other specified diseases of biliary tract: Secondary | ICD-10-CM | POA: Insufficient documentation

## 2011-06-20 DIAGNOSIS — E119 Type 2 diabetes mellitus without complications: Secondary | ICD-10-CM | POA: Insufficient documentation

## 2011-06-20 DIAGNOSIS — R944 Abnormal results of kidney function studies: Secondary | ICD-10-CM | POA: Insufficient documentation

## 2011-07-03 ENCOUNTER — Ambulatory Visit (HOSPITAL_COMMUNITY)
Admission: RE | Admit: 2011-07-03 | Discharge: 2011-07-03 | Disposition: A | Discharge status: Home / Self Care | Payer: PRIVATE HEALTH INSURANCE | Source: Ambulatory Visit | Attending: Adult Health | Admitting: Adult Health

## 2011-07-03 DIAGNOSIS — I714 Abdominal aortic aneurysm, without rupture, unspecified: Secondary | ICD-10-CM | POA: Insufficient documentation

## 2011-07-03 DIAGNOSIS — Z992 Dependence on renal dialysis: Secondary | ICD-10-CM

## 2011-07-03 DIAGNOSIS — N289 Disorder of kidney and ureter, unspecified: Secondary | ICD-10-CM | POA: Insufficient documentation

## 2011-07-03 NOTE — Progress Notes (Signed)
VASCULAR LAB  Renal artery duplex completed.  Terance Hart, RVT 07/03/2011 10:54 AM

## 2011-08-20 ENCOUNTER — Encounter: Payer: Self-pay | Admitting: Neurosurgery

## 2011-08-20 ENCOUNTER — Encounter: Payer: Self-pay | Admitting: Vascular Surgery

## 2011-08-21 ENCOUNTER — Ambulatory Visit (INDEPENDENT_AMBULATORY_CARE_PROVIDER_SITE_OTHER): Payer: PRIVATE HEALTH INSURANCE | Admitting: *Deleted

## 2011-08-21 ENCOUNTER — Ambulatory Visit (INDEPENDENT_AMBULATORY_CARE_PROVIDER_SITE_OTHER): Payer: PRIVATE HEALTH INSURANCE | Admitting: Neurosurgery

## 2011-08-21 ENCOUNTER — Encounter: Payer: Self-pay | Admitting: Neurosurgery

## 2011-08-21 VITALS — BP 128/74 | HR 78 | Resp 14 | Ht 69.0 in | Wt 208.0 lb

## 2011-08-21 DIAGNOSIS — I714 Abdominal aortic aneurysm, without rupture, unspecified: Secondary | ICD-10-CM | POA: Insufficient documentation

## 2011-08-21 NOTE — Progress Notes (Signed)
.. [facility] [place] HISTORY AND PHYSICAL   CC: Six-month AAA duplex Referring Physician: Hart Rochester  History of Present Illness: 78 year old patient of Dr. Candie Chroman followup today for AAA duplex evaluation of sac size. The patient is seen today with his daughter. They report no signs of stomach pain low back pain or other difficulties other than gallbladder problems for which he scheduling surgery as soon as possible.  Past Medical History  Diagnosis Date  . Hyperlipidemia   . Diabetes mellitus     Type II  . Cholelithiasis     which has resolved, apparently  . Hypertension   . Anemia     diffuse  . COPD (chronic obstructive pulmonary disease)   . Pneumonia   . Stroke 1972    CVA  . AAA (abdominal aortic aneurysm) 02/11/07    ROS: [x]  Positive   [ ]  Denies    General: [ ]  Weight loss, [ ]  Fever, [ ]  chills Neurologic: [ ]  Dizziness, [ ]  Blackouts, [ ]  Seizure [ ]  Stroke, [ ]  "Mini stroke", [ ]  Slurred speech, [ ]  Temporary blindness; [ ]  weakness in arms or legs, [ ]  Hoarseness Cardiac: [ ]  Chest pain/pressure, [ ]  Shortness of breath at rest [ ]  Shortness of breath with exertion, [ ]  Atrial fibrillation or irregular heartbeat Vascular: [ ]  Pain in legs with walking, [ ]  Pain in legs at rest, [ ]  Pain in legs at night,  [ ]  Non-healing ulcer, [ ]  Blood clot in vein/DVT,   Pulmonary: [ ]  Home oxygen, [ ]  Productive cough, [ ]  Coughing up blood, [ ]  Asthma,  [ ]  Wheezing Musculoskeletal:  [ ]  Arthritis, [ ]  Low back pain, [ ]  Joint pain Hematologic: [ ]  Easy Bruising, [ ]  Anemia; [ ]  Hepatitis Gastrointestinal: [ ]  Blood in stool, [ ]  Gastroesophageal Reflux/heartburn, [ ]  Trouble swallowing Urinary: [ ]  chronic Kidney disease, [ ]  on HD - [ ]  MWF or [ ]  TTHS, [ ]  Burning with urination, [ ]  Difficulty urinating Skin: [ ]  Rashes, [ ]  Wounds Psychological: [ ]  Anxiety, [ ]  Depression   Social History History  Substance Use Topics  . Smoking status:  Current Everyday Smoker    Types: Cigarettes    Last Attempt to Quit: 05/21/1968  . Smokeless tobacco: Not on file  . Alcohol Use: 0.5 oz/week    1 drink(s) per week    Family History Family History  Problem Relation Age of Onset  . Coronary artery disease Brother     No Known Allergies  Current Outpatient Prescriptions  Medication Sig Dispense Refill  . allopurinol (ZYLOPRIM) 100 MG tablet Take 100 mg by mouth daily.      Marland Kitchen aspirin 81 MG tablet Take 81 mg by mouth daily.      . fish oil-omega-3 fatty acids 1000 MG capsule Take 1,000 mg by mouth daily.      Marland Kitchen glipiZIDE (GLUCOTROL) 5 MG tablet Take 5 mg by mouth 2 (two) times daily before a meal.      . pioglitazone (ACTOS) 30 MG tablet Take 30 mg by mouth daily.      . rosuvastatin (CRESTOR) 20 MG tablet Take 20 mg by mouth daily.      . enalapril (VASOTEC) 10 MG tablet Take 10 mg by mouth daily.      Marland Kitchen glyBURIDE-metformin (GLUCOVANCE) 5-500 MG per tablet Take 1 tablet by mouth daily with breakfast.        Physical  Examination  Filed Vitals:   08/21/11 1007  BP: 128/74  Pulse: 78  Resp: 14    Body mass index is 30.72 kg/(m^2).  General:  WDWN in NAD Gait: Normal HEENT: WNL Eyes: Pupils equal Pulmonary: normal non-labored breathing , without Rales, rhonchi,  wheezing Cardiac: RRR, without  Murmurs, rubs or gallops; No carotid bruits Abdomen: soft, NT, no masses Skin: no rashes, ulcers noted  Extremities without ischemic changes, no Gangrene , no cellulitis; no open wounds;  Musculoskeletal: no muscle wasting or atrophy  Neurologic: A&O X 3; Appropriate Affect ; SENSATION: normal; MOTOR FUNCTION:  moving all extremities equally. Speech is fluent/normal  Non-Invasive Vascular Imaging:Vascular Exam/Pulses: AAA ultrasound today shows a mid sac measurement of 4.7 in transverse measurement of 4.7, last measurement in the chart is from October 2011 at which time it was 4.2. The patient has 2+ radial pulses bilaterally,  due to abdominal girth he AAA is not palpable due to pain with palpation related to his gallbladder problems. He does have a palpable PT and DP pulses bilaterally. Also of note there are no palpable popliteal pulses but he does have 2+ bilateral femoral pulses.  ASSESSMENT/PLAN: Patient with slightly increased mid sac measurement of 4.7 which is increased from 4.2 almost 18 months ago. He will followup in 6 months with a repeat AAA duplex and will be seen in my clinic his questions were encouraged and answered.  Lauree Chandler ANP  Clinic M.D.: Early

## 2011-08-27 NOTE — Procedures (Unsigned)
DUPLEX ULTRASOUND OF ABDOMINAL AORTA  INDICATION:  Follow up AAA.  HISTORY: Diabetes:  Yes Cardiac:  No Hypertension:  Yes Smoking:  Yes Connective Tissue Disorder: Family History:  No Previous Surgery:  No  DUPLEX EXAM:         AP (cm)                   TRANSVERSE (cm) Proximal             2.76 cm                   2.71 cm Mid                  4.7 cm                    4.7 cm Distal               1.65 cm                   1.69 cm Right Iliac          1.07 cm Left Iliac           1.26 cm  PREVIOUS:  Date: 02/27/2011  AP:  4.8  TRANSVERSE:  4.7  IMPRESSION:  Stable AAA measuring approximately 4.7 cm x 4.7 cm on today's exam.  ___________________________________________ Quita Skye Hart [place], M.D.  LT/MEDQ  D:  08/22/2011  T:  08/22/2011  Job:  [id number]

## 2011-08-30 ENCOUNTER — Encounter (HOSPITAL_COMMUNITY): Payer: Self-pay | Admitting: Pharmacy Technician

## 2011-08-31 ENCOUNTER — Encounter (HOSPITAL_COMMUNITY): Payer: Self-pay

## 2011-08-31 ENCOUNTER — Encounter (HOSPITAL_COMMUNITY)
Admission: RE | Admit: 2011-08-31 | Discharge: 2011-08-31 | Disposition: A | Discharge status: Home / Self Care | Payer: PRIVATE HEALTH INSURANCE | Source: Ambulatory Visit | Attending: General Surgery | Admitting: General Surgery

## 2011-08-31 HISTORY — DX: Cerebral aneurysm, nonruptured: I67.1

## 2011-08-31 LAB — DIFFERENTIAL
Basophils Absolute: 0 10*3/uL (ref 0.0–0.1)
Basophils Relative: 1 % (ref 0–1)
Eosinophils Relative: 3 % (ref 0–5)
Lymphocytes Relative: 37 % (ref 12–46)
Monocytes Absolute: 0.3 10*3/uL (ref 0.1–1.0)
Neutro Abs: 3.1 10*3/uL (ref 1.7–7.7)

## 2011-08-31 LAB — CBC
HCT: 34.8 % — ABNORMAL LOW (ref 39.0–52.0)
MCHC: 32.8 g/dL (ref 30.0–36.0)
MCV: 94.6 fL (ref 78.0–100.0)
Platelets: 270 10*3/uL (ref 150–400)
RDW: 14 % (ref 11.5–15.5)
WBC: 5.7 10*3/uL (ref 4.0–10.5)

## 2011-08-31 LAB — BASIC METABOLIC PANEL
BUN: 26 mg/dL — ABNORMAL HIGH (ref 6–23)
Calcium: 9.5 mg/dL (ref 8.4–10.5)
Creatinine, Ser: 1.79 mg/dL — ABNORMAL HIGH (ref 0.50–1.35)
GFR calc Af Amer: 40 mL/min — ABNORMAL LOW (ref >90)

## 2011-08-31 LAB — SURGICAL PCR SCREEN: Staphylococcus aureus: NEGATIVE

## 2011-08-31 NOTE — Patient Instructions (Addendum)
20 Dylan [name]  08/31/2011   Your procedure is scheduled on:   09/10/2011  Report to [facility] at  720 AM.  Call this number if you have problems the morning of surgery: [phone]   Remember:   Do not eat food:After Midnight.  May have clear liquids:until Midnight .  Clear liquids include soda, tea, black coffee, apple or grape juice, broth.  Take these medicines the morning of surgery with A SIP OF WATER: aloopurinol   Do not wear jewelry, make-up or nail polish.  Do not wear lotions, powders, or perfumes. You may wear deodorant.  Do not shave 48 hours prior to surgery.  Do not bring valuables to the hospital.  Contacts, dentures or bridgework may not be worn into surgery.  Leave suitcase in the car. After surgery it may be brought to your room.  For patients admitted to the hospital, checkout time is 11:00 AM the day of discharge.   Patients discharged the day of surgery will not be allowed to drive home.  Name and phone number of your driver: family  Special Instructions: CHG Shower Use Special Wash: 1/2 bottle night before surgery and 1/2 bottle morning of surgery.   Please read over the following fact sheets that you were given: Pain Booklet, MRSA Information, Surgical Site Infection Prevention, Anesthesia Post-op Instructions and Care and Recovery After Surgery Laparoscopic Cholecystectomy Laparoscopic cholecystectomy is surgery to remove the gallbladder. The gallbladder is located slightly to the right of center in the abdomen, behind the liver. It is a concentrating and storage sac for the bile produced in the liver. Bile aids in the digestion and absorption of fats. Gallbladder disease (cholecystitis) is an inflammation of your gallbladder. This condition is usually caused by a buildup of gallstones (cholelithiasis) in your gallbladder. Gallstones can block the flow of bile, resulting in inflammation and pain. In severe cases, emergency surgery may be required. When emergency  surgery is not required, you will have time to prepare for the procedure. Laparoscopic surgery is an alternative to open surgery. Laparoscopic surgery usually has a shorter recovery time. Your common bile duct may also need to be examined and explored. Your caregiver will discuss this with you if he or she feels this should be done. If stones are found in the common bile duct, they may be removed. LET YOUR CAREGIVER KNOW ABOUT:  Allergies to food or medicine.   Medicines taken, including vitamins, herbs, eyedrops, over-the-counter medicines, and creams.   Use of steroids (by mouth or creams).   Previous problems with anesthetics or numbing medicines.   History of bleeding problems or blood clots.   Previous surgery.   Other health problems, including diabetes and kidney problems.   Possibility of pregnancy, if this applies.  RISKS AND COMPLICATIONS All surgery is associated with risks. Some problems that may occur following this procedure include:  Infection.   Damage to the common bile duct, nerves, arteries, veins, or other internal organs such as the stomach or intestines.   Bleeding.   A stone may remain in the common bile duct.  BEFORE THE PROCEDURE  Do not take aspirin for 3 days prior to surgery or blood thinners for 1 week prior to surgery.   Do not eat or drink anything after midnight the night before surgery.   Let your caregiver know if you develop a cold or other infectious problem prior to surgery.   You should be present 60 minutes before the procedure or as directed.  PROCEDURE  You will be given medicine that makes you sleep (general anesthetic). When you are asleep, your surgeon will make several small cuts (incisions) in your abdomen. One of these incisions is used to insert a small, lighted scope (laparoscope) into the abdomen. The laparoscope helps the surgeon see into your abdomen. Carbon dioxide gas will be pumped into your abdomen. The gas allows more  room for the surgeon to perform your surgery. Other operating instruments are inserted through the other incisions. Laparoscopic procedures may not be appropriate when:  There is major scarring from previous surgery.   The gallbladder is extremely inflamed.   There are bleeding disorders or unexpected cirrhosis of the liver.   A pregnancy is near term.   Other conditions make the laparoscopic procedure impossible.  If your surgeon feels it is not safe to continue with a laparoscopic procedure, he or she will perform an open abdominal procedure. In this case, the surgeon will make an incision to open the abdomen. This gives the surgeon a larger view and field to work within. This may allow the surgeon to perform procedures that sometimes cannot be performed with a laparoscope alone. Open surgery has a longer recovery time. AFTER THE PROCEDURE  You will be taken to the recovery area where a nurse will watch and check your progress.   You may be allowed to go home the same day.   Do not resume physical activities until directed by your caregiver.   You may resume a normal diet and activities as directed.  Document Released: 05/07/2005 Document Revised: 04/26/2011 Document Reviewed: 10/20/2010 [facility] Patient Information 2012 [place], [place].PATIENT INSTRUCTIONS POST-ANESTHESIA  IMMEDIATELY FOLLOWING SURGERY:  Do not drive or operate machinery for the first twenty four hours after surgery.  Do not make any important decisions for twenty four hours after surgery or while taking narcotic pain medications or sedatives.  If you develop intractable nausea and vomiting or a severe headache please notify your doctor immediately.  FOLLOW-UP:  Please make an appointment with your surgeon as instructed. You do not need to follow up with anesthesia unless specifically instructed to do so.  WOUND CARE INSTRUCTIONS (if applicable):  Keep a dry clean dressing on the anesthesia/puncture wound site if  there is drainage.  Once the wound has quit draining you may leave it open to air.  Generally you should leave the bandage intact for twenty four hours unless there is drainage.  If the epidural site drains for more than 36-48 hours please call the anesthesia department.  QUESTIONS?:  Please feel free to call your physician or the hospital operator if you have any questions, and they will be happy to assist you.     [facility] [address] [place] [phone]

## 2011-09-02 NOTE — H&P (Signed)
  NTS SOAP Note  Vital Signs:  Vitals as of: 08/28/2011: Systolic 155: Diastolic 79: Heart Rate 70: Temp 97.39F: Height 35ft 9in: Weight 201Lbs 0 Ounces: OFC 0in: Respiratory Rate 0: O2 Saturation 0: Pain Level 0: BMI 30  BMI : 29.68 kg/m2  Subjective: This 78 Years 2 Months old Male presents for of Abdominal pain.   Patient has had a history of acute renal insufficency and was having an ultrasound performed to evaluate his kidneys and had gallstones noted on his u/s.  No history of RUQ pain.  No symptoms with fatty foods.  No bloating.  No sginificant family history of biliary disease.  No jaundice.  Has had some episodes of nausea.  Rare non-bloody emesis.    Review of Symptoms:  Constitutional:unremarkable Head:unremarkable Eyes:unremarkable Nose/Mouth/Throat:unremarkable Cardiovascular:unremarkable Respiratory:unremarkable as per HPI Genitourinary:unremarkable Musculoskeletal:unremarkable Skin:unremarkable Breast:unremarkable Hematolgic/Lymphatic:unremarkable Allergic/Immunologic:unremarkable   Past Medical History:Obtained   Past Medical History  Surgical History: none Medical Problems: HTN, DM, hyperlipidemia. Psychiatric History: none Allergies: NKDA Medications: glipizide, allopurinol, Antar, Pioglitazone, crestor, ASA.   Social History:Obtained  Social History  Preferred Language: English ([place]) Ethnicity: Not Hispanic / Latino Age: 78 Years 2 Months Marital Status:  M Alcohol: None Recreational drug(s): None   Smoking Status: Never smoker reviewed on 08/30/2011  Family History:Obtained   Family History  Is there a family history of:DM, CAD    Objective Information: General:Well appearing, well nourished in no distress. Skin:no rash or prominent lesions Head:Atraumatic; no masses; no abnormalities Eyes:conjunctiva clear, EOM intact, PERRL Mouth:Mucous membranes moist, no  mucosal lesions. Neck:Supple without lymphadenopathy.  Heart:RRR, no murmur Lungs:CTA bilaterally, no wheezes, rhonchi, rales.  Breathing unlabored. Abdomen:Soft, NT/ND, no HSM, no masses. Extremities:No deformities, clubbing, cyanosis, or edema.  Musculoskeletal:unremarkable  RUQ u/s:  +stones.      Assessment:  Diagnosis &amp; Procedure: DiagnosisCode: 574.00, ProcedureCode: 11914,    Plan: Cholelithiasis.  I did discuss with patient surgical options.  Patient will decide and will proceed at his convenience.  Patient to call with issues.    Patient Education:Alternative treatments to surgery were discussed with patient (and family).Risks and benefits  of procedure were fully explained to the patient (and family) who gave informed consent. Patient/family questions were addressed.  Follow-up:Pending Surgery                                             Active Diagnosis and Procedures: 574.00 Calculus of gallbladder with acute cholecystitis, without mention of obstruction   99203 - OFFICE OUTPATIENT NEW 30 MINUTES

## 2011-09-10 ENCOUNTER — Encounter (HOSPITAL_COMMUNITY)
Admission: RE | Disposition: A | Discharge status: Home / Self Care | Payer: Self-pay | Source: Ambulatory Visit | Attending: General Surgery

## 2011-09-10 ENCOUNTER — Ambulatory Visit (HOSPITAL_COMMUNITY): Payer: PRIVATE HEALTH INSURANCE | Admitting: Anesthesiology

## 2011-09-10 ENCOUNTER — Ambulatory Visit (HOSPITAL_COMMUNITY)
Admission: RE | Admit: 2011-09-10 | Discharge: 2011-09-10 | Disposition: A | Discharge status: Home / Self Care | Payer: PRIVATE HEALTH INSURANCE | Source: Ambulatory Visit | Attending: General Surgery | Admitting: General Surgery

## 2011-09-10 ENCOUNTER — Encounter (HOSPITAL_COMMUNITY): Payer: Self-pay | Admitting: Anesthesiology

## 2011-09-10 ENCOUNTER — Encounter (HOSPITAL_COMMUNITY): Payer: Self-pay | Admitting: *Deleted

## 2011-09-10 DIAGNOSIS — J4489 Other specified chronic obstructive pulmonary disease: Secondary | ICD-10-CM | POA: Insufficient documentation

## 2011-09-10 DIAGNOSIS — E785 Hyperlipidemia, unspecified: Secondary | ICD-10-CM | POA: Insufficient documentation

## 2011-09-10 DIAGNOSIS — Z79899 Other long term (current) drug therapy: Secondary | ICD-10-CM | POA: Insufficient documentation

## 2011-09-10 DIAGNOSIS — Z7982 Long term (current) use of aspirin: Secondary | ICD-10-CM | POA: Insufficient documentation

## 2011-09-10 DIAGNOSIS — Z01810 Encounter for preprocedural cardiovascular examination: Secondary | ICD-10-CM | POA: Insufficient documentation

## 2011-09-10 DIAGNOSIS — I10 Essential (primary) hypertension: Secondary | ICD-10-CM | POA: Insufficient documentation

## 2011-09-10 DIAGNOSIS — Z01812 Encounter for preprocedural laboratory examination: Secondary | ICD-10-CM | POA: Insufficient documentation

## 2011-09-10 DIAGNOSIS — K8010 Calculus of gallbladder with chronic cholecystitis without obstruction: Secondary | ICD-10-CM

## 2011-09-10 DIAGNOSIS — E119 Type 2 diabetes mellitus without complications: Secondary | ICD-10-CM | POA: Insufficient documentation

## 2011-09-10 DIAGNOSIS — J449 Chronic obstructive pulmonary disease, unspecified: Secondary | ICD-10-CM | POA: Insufficient documentation

## 2011-09-10 HISTORY — PX: CHOLECYSTECTOMY: SHX55

## 2011-09-10 LAB — GLUCOSE, CAPILLARY: Glucose-Capillary: 126 mg/dL — ABNORMAL HIGH (ref 70–99)

## 2011-09-10 SURGERY — LAPAROSCOPIC CHOLECYSTECTOMY
Anesthesia: General | Wound class: Dirty or Infected

## 2011-09-10 MED ORDER — FENTANYL CITRATE 0.05 MG/ML IJ SOLN
25.0000 ug | INTRAMUSCULAR | Status: DC | PRN
  Administered 2011-09-10 (×4): 50 ug via INTRAVENOUS

## 2011-09-10 MED ORDER — FENTANYL CITRATE 0.05 MG/ML IJ SOLN
INTRAMUSCULAR | Status: AC
  Administered 2011-09-10: 50 ug via INTRAVENOUS
  Filled 2011-09-10: qty 5

## 2011-09-10 MED ORDER — ENOXAPARIN SODIUM 40 MG/0.4ML ~~LOC~~ SOLN
SUBCUTANEOUS | Status: AC
  Filled 2011-09-10: qty 0.4

## 2011-09-10 MED ORDER — CELECOXIB 100 MG PO CAPS
ORAL_CAPSULE | ORAL | Status: AC
  Administered 2011-09-10: 400 mg via ORAL
  Filled 2011-09-10: qty 4

## 2011-09-10 MED ORDER — ONDANSETRON HCL 4 MG/2ML IJ SOLN
INTRAMUSCULAR | Status: AC
  Filled 2011-09-10: qty 2

## 2011-09-10 MED ORDER — HYDROCODONE-ACETAMINOPHEN 5-325 MG PO TABS: 1.0000 | ORAL_TABLET | ORAL | Status: AC | PRN

## 2011-09-10 MED ORDER — FENTANYL CITRATE 0.05 MG/ML IJ SOLN
INTRAMUSCULAR | Status: AC
  Administered 2011-09-10: 50 ug via INTRAVENOUS
  Filled 2011-09-10: qty 2

## 2011-09-10 MED ORDER — MIDAZOLAM HCL 5 MG/5ML IJ SOLN
INTRAMUSCULAR | Status: DC | PRN
  Administered 2011-09-10: 2 mg via INTRAVENOUS

## 2011-09-10 MED ORDER — FENTANYL CITRATE 0.05 MG/ML IJ SOLN
INTRAMUSCULAR | Status: AC
  Filled 2011-09-10: qty 2

## 2011-09-10 MED ORDER — LACTATED RINGERS IV SOLN
INTRAVENOUS | Status: DC
  Administered 2011-09-10: 09:00:00 via INTRAVENOUS

## 2011-09-10 MED ORDER — ROCURONIUM BROMIDE 100 MG/10ML IV SOLN
INTRAVENOUS | Status: DC | PRN
  Administered 2011-09-10: 35 mg via INTRAVENOUS

## 2011-09-10 MED ORDER — GLYCOPYRROLATE 0.2 MG/ML IJ SOLN
INTRAMUSCULAR | Status: DC | PRN
  Administered 2011-09-10: 0.2 mg via INTRAVENOUS

## 2011-09-10 MED ORDER — PROPOFOL 10 MG/ML IV EMUL
INTRAVENOUS | Status: AC
  Filled 2011-09-10: qty 20

## 2011-09-10 MED ORDER — MIDAZOLAM HCL 2 MG/2ML IJ SOLN
INTRAMUSCULAR | Status: AC
  Filled 2011-09-10: qty 2

## 2011-09-10 MED ORDER — CEFAZOLIN SODIUM 1-5 GM-% IV SOLN
INTRAVENOUS | Status: DC | PRN
  Administered 2011-09-10: 1 g via INTRAVENOUS

## 2011-09-10 MED ORDER — BUPIVACAINE HCL (PF) 0.5 % IJ SOLN
INTRAMUSCULAR | Status: DC | PRN
  Administered 2011-09-10: 10 mL

## 2011-09-10 MED ORDER — CELECOXIB 100 MG PO CAPS
400.0000 mg | ORAL_CAPSULE | Freq: Every day | ORAL | Status: AC
  Administered 2011-09-10: 400 mg via ORAL

## 2011-09-10 MED ORDER — GLYCOPYRROLATE 0.2 MG/ML IJ SOLN
INTRAMUSCULAR | Status: AC
  Filled 2011-09-10: qty 1

## 2011-09-10 MED ORDER — HEMOSTATIC AGENTS (NO CHARGE) OPTIME
TOPICAL | Status: DC | PRN
  Administered 2011-09-10 (×2): 1 via TOPICAL

## 2011-09-10 MED ORDER — ONDANSETRON HCL 4 MG/2ML IJ SOLN
4.0000 mg | Freq: Once | INTRAMUSCULAR | Status: AC
  Administered 2011-09-10: 4 mg via INTRAVENOUS

## 2011-09-10 MED ORDER — MIDAZOLAM HCL 2 MG/2ML IJ SOLN
1.0000 mg | INTRAMUSCULAR | Status: DC | PRN
  Administered 2011-09-10: 1 mg via INTRAVENOUS

## 2011-09-10 MED ORDER — LIDOCAINE HCL 1 % IJ SOLN
INTRAMUSCULAR | Status: DC | PRN
  Administered 2011-09-10: 35 mg via INTRADERMAL

## 2011-09-10 MED ORDER — ROCURONIUM BROMIDE 50 MG/5ML IV SOLN
INTRAVENOUS | Status: AC
  Filled 2011-09-10: qty 1

## 2011-09-10 MED ORDER — FENTANYL CITRATE 0.05 MG/ML IJ SOLN
INTRAMUSCULAR | Status: AC
Start: 2011-09-10 — End: 2011-09-10
  Administered 2011-09-10: 50 ug via INTRAVENOUS
  Filled 2011-09-10: qty 2

## 2011-09-10 MED ORDER — PROPOFOL 10 MG/ML IV BOLUS
INTRAVENOUS | Status: DC | PRN
  Administered 2011-09-10: 50 mg via INTRAVENOUS

## 2011-09-10 MED ORDER — CEFAZOLIN SODIUM 1-5 GM-% IV SOLN
INTRAVENOUS | Status: AC
  Filled 2011-09-10: qty 50

## 2011-09-10 MED ORDER — ENOXAPARIN SODIUM 40 MG/0.4ML ~~LOC~~ SOLN
40.0000 mg | Freq: Once | SUBCUTANEOUS | Status: AC
  Administered 2011-09-10: 40 mg via SUBCUTANEOUS

## 2011-09-10 MED ORDER — CEFAZOLIN SODIUM 1-5 GM-% IV SOLN: 1.0000 g | INTRAVENOUS | Status: DC

## 2011-09-10 MED ORDER — LABETALOL HCL 5 MG/ML IV SOLN
INTRAVENOUS | Status: DC | PRN
  Administered 2011-09-10 (×2): 10 mg via INTRAVENOUS

## 2011-09-10 MED ORDER — FENTANYL CITRATE 0.05 MG/ML IJ SOLN
INTRAMUSCULAR | Status: DC | PRN
  Administered 2011-09-10 (×2): 100 ug via INTRAVENOUS
  Administered 2011-09-10: 50 ug via INTRAVENOUS

## 2011-09-10 MED ORDER — LABETALOL HCL 5 MG/ML IV SOLN
INTRAVENOUS | Status: AC
  Filled 2011-09-10: qty 4

## 2011-09-10 MED ORDER — NEOSTIGMINE METHYLSULFATE 1 MG/ML IJ SOLN
INTRAMUSCULAR | Status: DC | PRN
  Administered 2011-09-10: 2 mg via INTRAVENOUS

## 2011-09-10 MED ORDER — BUPIVACAINE HCL (PF) 0.5 % IJ SOLN
INTRAMUSCULAR | Status: AC
  Filled 2011-09-10: qty 30

## 2011-09-10 MED ORDER — SODIUM CHLORIDE 0.9 % IR SOLN
Status: DC | PRN
  Administered 2011-09-10: 3000 mL

## 2011-09-10 MED ORDER — ONDANSETRON HCL 4 MG/2ML IJ SOLN: 4.0000 mg | Freq: Once | INTRAMUSCULAR | Status: DC | PRN

## 2011-09-10 SURGICAL SUPPLY — 36 items
APPLIER CLIP UNV 5X34 EPIX (ENDOMECHANICALS) ×2 IMPLANT
BAG HAMPER (MISCELLANEOUS) ×2 IMPLANT
BENZOIN TINCTURE PRP APPL 2/3 (GAUZE/BANDAGES/DRESSINGS) ×2 IMPLANT
CLOTH BEACON ORANGE TIMEOUT ST (SAFETY) ×2 IMPLANT
COVER SURGICAL LIGHT HANDLE (MISCELLANEOUS) ×4 IMPLANT
DECANTER SPIKE VIAL GLASS SM (MISCELLANEOUS) ×2 IMPLANT
DEVICE TROCAR PUNCTURE CLOSURE (ENDOMECHANICALS) ×2 IMPLANT
DURAPREP 26ML APPLICATOR (WOUND CARE) ×2 IMPLANT
ELECT REM PT RETURN 9FT ADLT (ELECTROSURGICAL) ×2
ELECTRODE REM PT RTRN 9FT ADLT (ELECTROSURGICAL) ×1 IMPLANT
FILTER SMOKE EVAC LAPAROSHD (FILTER) ×2 IMPLANT
FORMALIN 10 PREFIL 120ML (MISCELLANEOUS) ×2 IMPLANT
GLOVE BIO SURGEON STRL SZ7 (GLOVE) ×2 IMPLANT
GLOVE BIOGEL PI IND STRL 7.5 (GLOVE) ×1 IMPLANT
GLOVE BIOGEL PI INDICATOR 7.5 (GLOVE) ×1
GLOVE ECLIPSE 7.0 STRL STRAW (GLOVE) ×2 IMPLANT
GOWN STRL REIN XL XLG (GOWN DISPOSABLE) ×6 IMPLANT
HEMOSTAT SNOW SURGICEL 2X4 (HEMOSTASIS) ×4 IMPLANT
INST SET LAPROSCOPIC AP (KITS) ×2 IMPLANT
IV NS IRRIG 3000ML ARTHROMATIC (IV SOLUTION) ×2 IMPLANT
KIT ROOM TURNOVER APOR (KITS) ×2 IMPLANT
MANIFOLD NEPTUNE II (INSTRUMENTS) ×2 IMPLANT
NEEDLE INSUFFLATION 14GA 120MM (NEEDLE) ×2 IMPLANT
PACK LAP CHOLE LZT030E (CUSTOM PROCEDURE TRAY) ×2 IMPLANT
PAD ARMBOARD 7.5X6 YLW CONV (MISCELLANEOUS) ×2 IMPLANT
POUCH SPECIMEN RETRIEVAL 10MM (ENDOMECHANICALS) ×2 IMPLANT
SET BASIN LINEN APH (SET/KITS/TRAYS/PACK) ×2 IMPLANT
SET TUBE IRRIG SUCTION NO TIP (IRRIGATION / IRRIGATOR) ×2 IMPLANT
SLEEVE Z-THREAD 5X100MM (TROCAR) ×4 IMPLANT
STRIP CLOSURE SKIN 1/2X4 (GAUZE/BANDAGES/DRESSINGS) ×2 IMPLANT
SUT MNCRL AB 4-0 PS2 18 (SUTURE) ×4 IMPLANT
SUT VIC AB 2-0 CT2 27 (SUTURE) ×4 IMPLANT
SYR 50ML LL SCALE MARK (SYRINGE) ×2 IMPLANT
TROCAR Z-THRD FIOS HNDL 11X100 (TROCAR) ×2 IMPLANT
TROCAR Z-THREAD FIOS 5X100MM (TROCAR) ×2 IMPLANT
WARMER LAPAROSCOPE (MISCELLANEOUS) ×2 IMPLANT

## 2011-09-10 NOTE — Interval H&P Note (Signed)
History and Physical Interval Note:  09/10/2011 8:44 AM  Dylan [name]  has presented today for surgery, with the diagnosis of Calculus of gallbladder with other cholecystitis, without mention of obstruction   The various methods of treatment have been discussed with the patient and family. After consideration of risks, benefits and other options for treatment, the patient has consented to  Procedure(s) (LRB): LAPAROSCOPIC CHOLECYSTECTOMY (N/A) as a surgical intervention .  The patients' history has been reviewed, patient examined, no change in status, stable for surgery.  I have reviewed the patients' chart and labs.  Questions were answered to the patient's satisfaction.     [name] C

## 2011-09-10 NOTE — Transfer of Care (Signed)
Immediate Anesthesia Transfer of Care Note  Patient: Dylan [name]  Procedure(s) Performed: Procedure(s) (LRB): LAPAROSCOPIC CHOLECYSTECTOMY (N/A)  Patient Location: PACU  Anesthesia Type: General  Level of Consciousness: awake and patient cooperative  Airway & Oxygen Therapy: Patient Spontanous Breathing and Patient connected to face mask oxygen  Post-op Assessment: Report given to PACU RN, Post -op Vital signs reviewed and stable and Patient moving all extremities  Post vital signs: Reviewed and stable  Complications: No apparent anesthesia complications

## 2011-09-10 NOTE — Discharge Instructions (Signed)

## 2011-09-10 NOTE — Anesthesia Preprocedure Evaluation (Addendum)
Anesthesia Evaluation  Patient identified by MRN, date of birth, ID band Patient awake    Reviewed: Allergy & Precautions, H&P , NPO status , Patient's Chart, lab work & pertinent test results  History of Anesthesia Complications Negative for: history of anesthetic complications  Airway Mallampati: III  Neck ROM: Full    Dental  (+) Edentulous Upper and Edentulous Lower   Pulmonary COPDCurrent Smoker,  breath sounds clear to auscultation        Cardiovascular hypertension, + Peripheral Vascular Disease (AAA 4.7cm) Rhythm:Regular Rate:Normal     Neuro/Psych CVA, No Residual Symptoms    GI/Hepatic   Endo/Other  Diabetes mellitus-, Well Controlled, Type 2, Oral Hypoglycemic Agents  Renal/GU Renal InsufficiencyRenal disease     Musculoskeletal   Abdominal   Peds  Hematology   Anesthesia Other Findings   Reproductive/Obstetrics                           Anesthesia Physical Anesthesia Plan  ASA: III  Anesthesia Plan: General   Post-op Pain Management:    Induction: Intravenous  Airway Management Planned: Oral ETT  Additional Equipment:   Intra-op Plan:   Post-operative Plan:   Informed Consent: I have reviewed the patients History and Physical, chart, labs and discussed the procedure including the risks, benefits and alternatives for the proposed anesthesia with the patient or authorized representative who has indicated his/her understanding and acceptance.     Plan Discussed with: CRNA  Anesthesia Plan Comments:        Anesthesia Quick Evaluation

## 2011-09-10 NOTE — Anesthesia Procedure Notes (Signed)
Procedure Name: Intubation Date/Time: 09/10/2011 9:19 AM Performed by: Despina Hidden Pre-anesthesia Checklist: Suction available, Emergency Drugs available, Patient identified and Patient being monitored Patient Re-evaluated:Patient Re-evaluated prior to inductionOxygen Delivery Method: Circle system utilized Preoxygenation: Pre-oxygenation with 100% oxygen Intubation Type: IV induction Ventilation: Mask ventilation without difficulty Laryngoscope Size: 3 and Mac Grade View: Grade I Tube type: Oral Tube size: 8.0 mm Number of attempts: 1 Airway Equipment and Method: Stylet Placement Confirmation: ETT inserted through vocal cords under direct vision,  breath sounds checked- equal and bilateral and positive ETCO2 Secured at: 22 cm Tube secured with: Tape Dental Injury: Teeth and Oropharynx as per pre-operative assessment

## 2011-09-10 NOTE — Op Note (Signed)
Patient:  Dylan [name]  DOB:  1931-02-08  MRN:  [id number]   Preop Diagnosis:  Cholelithiasis  Postop Diagnosis:  Acute and chronic cholecystitis with cholelithiasis  Procedure:  Laparoscopic cholecystectomy  Surgeon:  Dr. Tilford [name]  Anes:  General endotracheal, 0.5% Sensorcaine plain for local  Indications:  Patient is a 78 year old male present mild a history of epigastric and right upper quadrant abdominal pain. Workup and evaluation was consistent for acute cholecystitis and cholelithiasis. Risks benefits and alternatives of laparoscopic possible open cholecystectomy were discussed at length with the patient including but not limited to risk of bleeding, infection, bile leak, common bile duct injury, small bowel injury, interactive cardiac and pulmonary metastases. Patient's questions and concerns are addressed patient is consented for planned procedure.  Procedure note:  Patient was taken to the OR placed in the supine position an aortocaval Arnell Asal as repair was patient is a 60 minutes and it enters Anastas. The spinous abdomen was prepped with DuraPrep positioned in standard fashion. A stab incision was created supraumbilical 11 blade scalpel an additional dissection was carried out the subcutaneous tissue carried out using Coker clamp. This is utilized grass and hair normal fashion the cystic area. A Veress needle was inserted saline drop test CO2 confirmed intraperitoneal placement. Pneumoperitoneum is then initiated was sufficient pneumoperitoneum was obtained 11 mm insert overlaps applied visualization the trocar entered the peritoneum. At this point cannulas with the laparoscope is reinserted denies any  Erythematous area. This time for several omental adhesions on the anterior abdominal wall on the right subcostal margin. These were taken down with electrocautery. The gallbladder is identified and is noted to be tense and thickened. A Weck needle is utilized to aspirate  the contents of the gallbladder or transfer is purulent. This did allow enough reduction of the distention of the gallbladder to grasp it with a regular aspirin listed on the right lobe liver. The dissection is carried out using a combination now and I sent her a the second irrigator to identify the cystic duct once the thenar flexion to the infundibulum is straight. Similarly the cystic artery is identified. Tourniquets placed proximally on the cystic duct and one distally and the duct was divided between the 2 masses of clips. Similarly the cystic artery was ligated tinnitus possibly one distally the cystic artery is about agenesis) Cardizem nystatin to cover for invalid fossa. Once free the 10 mm scope was exchanged for a 5 and the Endo Catch bag was inserted. The gallbladder is placed in an Endo Catch bag as well as several small spilled stones. The images since the right lower quadrant and the scope is reactions back to a 10 mm scope. At this point to obtain hemostasis in the gallbladder fossa using electrocautery. The surgical site is well irrigated and color returning aspirate was clear. Hemostasis is good and the introducer inspected. There is no evidence of any bleeding or bile leak from the endoclips. He said the wrong nature of the gallbladder fossae date of the placed to separate his is Surgicel snow into the gallbladder fossa. The omentum was also placed up in this area. This is quite pleased with the hemostasis and terminal attention to closure.  Using Endo Close suture passing device a 2-0 Vicryl suture fashion and local trocar site. With this suture and placed the gallbladder was retrieved through through the umbilical trocar site and intact Endo Catch bag. The gallbladder is placed in the back table and did inspected upon completion of the  operation. I did note the gallbladder was thickened but no discrete masses were noted. Several large stones were noted. Gallbladder since prior specimen to  pathology. At this point the pneumoperitoneum was evacuated. The Vicryl sutures secured. The local anesthetic is instilled. A 4-0 Monocryl utilized to reapproximate the skin edges at all 4 trocar sites. The skin was washed and dried and a dry towel. Benzoin was applied around the incision. Insertions are placed. Additionally the patient's left, Dylan [name] a psychiatrist to the postanesthetic care he in stable condition. At the conclusion the procedure all instruments, sponge, needle counts are correct. Patient tolerated procedure extremely well.  Complications:  None apparent  EBL:  Less than 150 mL  Specimen:  #1 gallbladder aspirate for cultures, #2 gallbladder for final pathology.

## 2011-09-10 NOTE — Anesthesia Postprocedure Evaluation (Signed)
  Anesthesia Post-op Note  Patient: Dylan [name]  Procedure(s) Performed: Procedure(s) (LRB): LAPAROSCOPIC CHOLECYSTECTOMY (N/A)  Patient Location: PACU  Anesthesia Type: General  Level of Consciousness: awake, alert , oriented and patient cooperative  Airway and Oxygen Therapy: Patient Spontanous Breathing  Post-op Pain: 4 /10, mild  Post-op Assessment: Post-op Vital signs reviewed, Patient's Cardiovascular Status Stable, Respiratory Function Stable, Patent Airway, No signs of Nausea or vomiting and Pain level controlled  Post-op Vital Signs: Reviewed and stable  Complications: No apparent anesthesia complications

## 2011-09-11 NOTE — Addendum Note (Signed)
Addendum  created 09/11/11 1212 by [place] Behlke J Jamese Trauger, CRNA   Modules edited:Notes Section    

## 2011-09-11 NOTE — Anesthesia Postprocedure Evaluation (Signed)
  Anesthesia Post-op Note  Patient: Dylan [name]  Procedure(s) Performed: Procedure(s) (LRB): LAPAROSCOPIC CHOLECYSTECTOMY (N/A)  Patient Location: PACU  Anesthesia Type: General  Level of Consciousness: awake, alert  and oriented  Airway and Oxygen Therapy: Patient Spontanous Breathing  Post-op Pain: none  Post-op Assessment: Post-op Vital signs reviewed, Patient's Cardiovascular Status Stable, Respiratory Function Stable, Patent Airway, No signs of Nausea or vomiting and Pain level controlled  Post-op Vital Signs: Reviewed and stable  Complications: No apparent anesthesia complications

## 2011-09-12 ENCOUNTER — Encounter (HOSPITAL_COMMUNITY): Payer: Self-pay | Admitting: General Surgery

## 2011-09-13 LAB — BODY FLUID CULTURE

## 2011-11-12 ENCOUNTER — Ambulatory Visit (HOSPITAL_COMMUNITY)
Admission: RE | Admit: 2011-11-12 | Discharge: 2011-11-12 | Disposition: A | Discharge status: Home / Self Care | Payer: PRIVATE HEALTH INSURANCE | Source: Ambulatory Visit | Attending: Family Medicine | Admitting: Family Medicine

## 2011-11-12 ENCOUNTER — Other Ambulatory Visit (HOSPITAL_COMMUNITY): Payer: Self-pay | Admitting: Family Medicine

## 2011-11-12 DIAGNOSIS — W19XXXA Unspecified fall, initial encounter: Secondary | ICD-10-CM

## 2011-11-12 DIAGNOSIS — M545 Low back pain, unspecified: Secondary | ICD-10-CM | POA: Insufficient documentation

## 2011-11-12 DIAGNOSIS — IMO0002 Reserved for concepts with insufficient information to code with codable children: Secondary | ICD-10-CM | POA: Insufficient documentation

## 2011-11-13 ENCOUNTER — Ambulatory Visit (HOSPITAL_COMMUNITY)
Admission: RE | Admit: 2011-11-13 | Discharge: 2011-11-13 | Disposition: A | Discharge status: Home / Self Care | Payer: PRIVATE HEALTH INSURANCE | Source: Ambulatory Visit | Attending: Family Medicine | Admitting: Family Medicine

## 2011-11-13 ENCOUNTER — Other Ambulatory Visit (HOSPITAL_COMMUNITY): Payer: Self-pay | Admitting: Family Medicine

## 2011-11-13 DIAGNOSIS — I714 Abdominal aortic aneurysm, without rupture, unspecified: Secondary | ICD-10-CM | POA: Insufficient documentation

## 2011-11-14 ENCOUNTER — Other Ambulatory Visit: Payer: Self-pay | Admitting: *Deleted

## 2011-11-14 DIAGNOSIS — I714 Abdominal aortic aneurysm, without rupture: Secondary | ICD-10-CM

## 2011-11-14 DIAGNOSIS — Z01810 Encounter for preprocedural cardiovascular examination: Secondary | ICD-10-CM

## 2011-12-13 ENCOUNTER — Other Ambulatory Visit: Payer: Self-pay | Admitting: Vascular Surgery

## 2011-12-14 LAB — CREATININE, SERUM: Creat: 1.87 mg/dL — ABNORMAL HIGH (ref 0.50–1.35)

## 2011-12-17 ENCOUNTER — Encounter: Payer: Self-pay | Admitting: Vascular Surgery

## 2011-12-18 ENCOUNTER — Other Ambulatory Visit: Payer: Self-pay | Admitting: *Deleted

## 2011-12-18 ENCOUNTER — Ambulatory Visit
Admission: RE | Admit: 2011-12-18 | Discharge: 2011-12-18 | Disposition: A | Discharge status: Home / Self Care | Payer: PRIVATE HEALTH INSURANCE | Source: Ambulatory Visit | Attending: Vascular Surgery | Admitting: Vascular Surgery

## 2011-12-18 ENCOUNTER — Ambulatory Visit (INDEPENDENT_AMBULATORY_CARE_PROVIDER_SITE_OTHER): Payer: PRIVATE HEALTH INSURANCE | Admitting: Vascular Surgery

## 2011-12-18 ENCOUNTER — Encounter: Payer: Self-pay | Admitting: Vascular Surgery

## 2011-12-18 VITALS — BP 195/90 | HR 81 | Resp 20 | Ht 69.0 in | Wt 207.0 lb

## 2011-12-18 DIAGNOSIS — I714 Abdominal aortic aneurysm, without rupture, unspecified: Secondary | ICD-10-CM | POA: Insufficient documentation

## 2011-12-18 DIAGNOSIS — W19XXXA Unspecified fall, initial encounter: Secondary | ICD-10-CM | POA: Insufficient documentation

## 2011-12-18 DIAGNOSIS — Z01810 Encounter for preprocedural cardiovascular examination: Secondary | ICD-10-CM

## 2011-12-18 NOTE — Addendum Note (Signed)
Addended by: Sharee Pimple on: 12/18/2011 02:35 PM   Modules accepted: Orders

## 2011-12-18 NOTE — Addendum Note (Signed)
Addended by: Sharee Pimple on: 12/18/2011 03:54 PM   Modules accepted: Orders

## 2011-12-18 NOTE — Progress Notes (Signed)
Subjective:     Patient ID: Dylan [name], male   DOB: 06/13/1930, 78 y.o.   MRN: [id number]  HPI this 78-year-old male returned today to discuss further his abdominal aortic aneurysm. CT scan without contrast was obtained today. I reviewed this by computer. His baseline creatinine is 1.86 the GFR of 36 . Patient has no history of coronary artery disease. He has had significant enlargement aneurysm which now measures a maximum of 5.6 cm in diameter.  Past Medical History  Diagnosis Date  . Hyperlipidemia   . Diabetes mellitus     Type II  . Cholelithiasis     which has resolved, apparently  . Hypertension   . Anemia     diffuse  . COPD (chronic obstructive pulmonary disease)   . Pneumonia   . AAA (abdominal aortic aneurysm) 02/11/07  . Stroke 1972    CVA  . Glaucoma   . Gout   . Brain aneurysm     hx of ruptured aneurysm in 70's    History  Substance Use Topics  . Smoking status: Former Smoker -- 1.0 packs/day for 25 years    Types: Cigarettes    Quit date: 05/21/1968  . Smokeless tobacco: Former User    Quit date: 12/18/1971  . Alcohol Use: 0.5 oz/week    1 drink(s) per week     rare    Family History  Problem Relation Age of Onset  . Coronary artery disease Brother   . Anesthesia problems Neg Hx   . Hypotension Neg Hx   . Malignant hyperthermia Neg Hx   . Pseudochol deficiency Neg Hx   . Cancer Mother     BREAST    No Known Allergies  Current outpatient prescriptions:allopurinol (ZYLOPRIM) 100 MG tablet, Take 100 mg by mouth daily., Disp: , Rfl: ;  aspirin 81 MG tablet, Take 81 mg by mouth daily., Disp: , Rfl: ;  fenofibrate micronized (ANTARA) 130 MG capsule, Take 130 mg by mouth daily., Disp: , Rfl: ;  fish oil-omega-3 fatty acids 1000 MG capsule, Take 1,000 mg by mouth daily., Disp: , Rfl: ;  glipiZIDE (GLUCOTROL) 10 MG tablet, Take 10 mg by mouth daily., Disp: , Rfl:  naproxen sodium (ALEVE) 220 MG tablet, Take 220 mg by mouth 2 (two) times daily with a meal.  Pain, Disp: , Rfl: ;  pioglitazone (ACTOS) 30 MG tablet, Take 30 mg by mouth daily., Disp: , Rfl: ;  rosuvastatin (CRESTOR) 20 MG tablet, Take 20 mg by mouth daily., Disp: , Rfl:   BP 195/90  Pulse 81  Resp 20  Ht 5' 9" (1.753 m)  Wt 207 lb (93.895 kg)  BMI 30.57 kg/m2  Body mass index is 30.57 kg/(m^2).          Review of Systems denies chest pain, dyspnea on exertion, PND, orthopnea, hemoptysis, claudication,     Objective:   Physical Exam blood pressure 195/90 heart rate 81 respirations 20 Gen.-alert and oriented x3 in no apparent distress HEENT normal for age Lungs no rhonchi or wheezing Cardiovascular regular rhythm no murmurs carotid pulses 3+ palpable no bruits audible Abdomen soft nontender -5 cm pulsatile mass-nontender  Musculoskeletal free of  major deformities Skin clear -no rashes Neurologic normal Lower extremities 3+ femoral and posterior tibial pulses palpable bilaterally with no edema  Today I reviewed his CT scan by computer. He does appear to be an adequate candidate for aortic stent grafting. The only concern is the small diameter of the terminal aorta.   We feel that this can be treated with laser and angioplasty satisfactorily.     Assessment:     Enlarging infrarenal abdominal aortic aneurysm-thought to be candidate for aortic stent grafting Need cardiac clearance will obtain Cardiolite    Plan:     #1 Will schedule Cardiolite exam #2 plan endovascular repair of AAA using the Gore endograft on a Wednesday, September 4  Risks of renal failure and cardiac issues discussed as well as technical problems related to terminal aorta with need for open repair Family would like to proceed with attempt at      

## 2011-12-21 ENCOUNTER — Other Ambulatory Visit: Payer: Self-pay

## 2011-12-24 ENCOUNTER — Ambulatory Visit (HOSPITAL_COMMUNITY): Payer: PRIVATE HEALTH INSURANCE | Attending: Cardiology | Admitting: Radiology

## 2011-12-24 VITALS — BP 158/83 | HR 68 | Ht 69.0 in | Wt 210.0 lb

## 2011-12-24 DIAGNOSIS — E785 Hyperlipidemia, unspecified: Secondary | ICD-10-CM | POA: Insufficient documentation

## 2011-12-24 DIAGNOSIS — I10 Essential (primary) hypertension: Secondary | ICD-10-CM | POA: Insufficient documentation

## 2011-12-24 DIAGNOSIS — Z01810 Encounter for preprocedural cardiovascular examination: Secondary | ICD-10-CM

## 2011-12-24 DIAGNOSIS — E119 Type 2 diabetes mellitus without complications: Secondary | ICD-10-CM | POA: Insufficient documentation

## 2011-12-24 DIAGNOSIS — I714 Abdominal aortic aneurysm, without rupture, unspecified: Secondary | ICD-10-CM

## 2011-12-24 DIAGNOSIS — Z87891 Personal history of nicotine dependence: Secondary | ICD-10-CM | POA: Insufficient documentation

## 2011-12-24 DIAGNOSIS — Z8249 Family history of ischemic heart disease and other diseases of the circulatory system: Secondary | ICD-10-CM | POA: Insufficient documentation

## 2011-12-24 DIAGNOSIS — I2510 Atherosclerotic heart disease of native coronary artery without angina pectoris: Secondary | ICD-10-CM | POA: Insufficient documentation

## 2011-12-24 DIAGNOSIS — E669 Obesity, unspecified: Secondary | ICD-10-CM | POA: Insufficient documentation

## 2011-12-24 DIAGNOSIS — Z8673 Personal history of transient ischemic attack (TIA), and cerebral infarction without residual deficits: Secondary | ICD-10-CM | POA: Insufficient documentation

## 2011-12-24 MED ORDER — TECHNETIUM TC 99M TETROFOSMIN IV KIT
30.0000 | PACK | Freq: Once | INTRAVENOUS | Status: AC | PRN
  Administered 2011-12-24: 30 via INTRAVENOUS

## 2011-12-24 MED ORDER — TECHNETIUM TC 99M TETROFOSMIN IV KIT
10.0000 | PACK | Freq: Once | INTRAVENOUS | Status: AC | PRN
  Administered 2011-12-24: 10 via INTRAVENOUS

## 2011-12-24 MED ORDER — REGADENOSON 0.4 MG/5ML IV SOLN
0.4000 mg | Freq: Once | INTRAVENOUS | Status: AC
  Administered 2011-12-24: 0.4 mg via INTRAVENOUS

## 2011-12-24 NOTE — Progress Notes (Signed)
[facility] SITE 3 NUCLEAR MED [address] [place] [address] [phone]  Cardiology Nuclear Med Study  Dylan [name] is a 78 y.o. male     MRN : [id number]     DOB: 01-Jul-1930  Procedure Date: 12/24/2011  Nuclear Med Background Indication for Stress Test:  Evaluation for Ischemia and Pending Surgical Clearance for AAA Aortic Stent Graft on 01/10/12 by Dr. Josephina Gip. History:  '95 Cath:"insignificant CAD"; '02 MPS:No ischemia or infarct, EF=61%. Cardiac Risk Factors: CVA, Family History - CAD, History of Smoking, Hypertension, Lipids, NIDDM and Obesity  Symptoms:  No cardiac symptoms.   Nuclear Pre-Procedure Caffeine/Decaff Intake:  None NPO After: 8:00am   Lungs:  Clear. O2 Sat: 99% on room air. IV 0.9% NS with Angio Cath:  22g  IV Site: R Antecubital  IV Started by:  Stanton Kidney, EMT-P  Chest Size (in):  44 Cup Size: n/a  Height: 5\' 9"  (1.753 m)  Weight:  210 lb (95.255 kg)  BMI:  Body mass index is 31.01 kg/(m^2). Tech Comments:  CBG= 79 @ 7:30 am, per patient.    Nuclear Med Study 1 or 2 day study: 1 day  Stress Test Type:  Lexiscan  Reading MD: Willa Rough, MD  Order Authorizing Provider:  Josephina Gip, MD  Resting Radionuclide: Technetium 73m Tetrofosmin  Resting Radionuclide Dose: 11.0 mCi   Stress Radionuclide:  Technetium 31m Tetrofosmin  Stress Radionuclide Dose: 32.6 mCi           Stress Protocol Rest HR: 68 Stress HR: 79  Rest BP: 158/83 Stress BP: 162/89  Exercise Time (min): n/a METS: n/a   Predicted Max HR: 141 bpm % Max HR: 56.03 bpm Rate Pressure Product: 65784   Dose of Adenosine (mg):  n/a Dose of Lexiscan: 0.4 mg  Dose of Atropine (mg): n/a Dose of Dobutamine: n/a mcg/kg/min (at max HR)  Stress Test Technologist: Smiley Houseman, CMA-N  Nuclear Technologist:  Doyne Keel, CNMT     Rest Procedure:  Myocardial perfusion imaging was performed at rest 45 minutes following the intravenous administration of Technetium 63m  Tetrofosmin.  Rest ECG: No acute changes.  Stress Procedure:  The patient received IV Lexiscan 0.4 mg over 15-seconds.  Technetium 7m Tetrofosmin injected at 30-seconds.  There were no significant changes with Lexiscan, rare PAC noted.  Quantitative spect images were obtained after a 45 minute delay.  Stress ECG: No significant change from baseline ECG  QPS Raw Data Images:  Normal; no motion artifact; normal heart/lung ratio. Stress Images:  Normal homogeneous uptake in all areas of the myocardium. Rest Images:  Normal homogeneous uptake in all areas of the myocardium. Subtraction (SDS):  No evidence of ischemia. Transient Ischemic Dilatation (Normal <1.22):  1.12 Lung/Heart Ratio (Normal <0.45):  0.31  Quantitative Gated Spect Images QGS EDV:  112 ml QGS ESV:  41 ml  Impression Exercise Capacity:  Lexiscan with no exercise. BP Response:  Normal blood pressure response. Clinical Symptoms:  dizzy ECG Impression:  No significant ST segment change suggestive of ischemia. Comparison with Prior Nuclear Study: No images to compare  Overall Impression:  Normal stress nuclear study.  LV Ejection Fraction: 63%.  LV Wall Motion:  NL LV Function; NL Wall Motion  Willa Rough, MD

## 2011-12-26 ENCOUNTER — Encounter (HOSPITAL_COMMUNITY): Payer: Self-pay | Admitting: Pharmacy Technician

## 2012-01-02 ENCOUNTER — Encounter (HOSPITAL_COMMUNITY)
Admission: RE | Admit: 2012-01-02 | Discharge: 2012-01-02 | Disposition: A | Discharge status: Home / Self Care | Payer: PRIVATE HEALTH INSURANCE | Source: Ambulatory Visit | Attending: Vascular Surgery | Admitting: Vascular Surgery

## 2012-01-02 ENCOUNTER — Encounter (HOSPITAL_COMMUNITY): Payer: Self-pay

## 2012-01-02 LAB — CBC
HCT: 36.1 % — ABNORMAL LOW (ref 39.0–52.0)
Hemoglobin: 11.6 g/dL — ABNORMAL LOW (ref 13.0–17.0)
RBC: 3.71 MIL/uL — ABNORMAL LOW (ref 4.22–5.81)
RDW: 14.6 % (ref 11.5–15.5)
WBC: 6 10*3/uL (ref 4.0–10.5)

## 2012-01-02 LAB — URINE MICROSCOPIC-ADD ON

## 2012-01-02 LAB — URINALYSIS, ROUTINE W REFLEX MICROSCOPIC
Glucose, UA: 100 mg/dL — AB
Leukocytes, UA: NEGATIVE
pH: 6.5 (ref 5.0–8.0)

## 2012-01-02 LAB — COMPREHENSIVE METABOLIC PANEL
Albumin: 3.5 g/dL (ref 3.5–5.2)
Alkaline Phosphatase: 44 U/L (ref 39–117)
BUN: 27 mg/dL — ABNORMAL HIGH (ref 6–23)
CO2: 26 mEq/L (ref 19–32)
Chloride: 105 mEq/L (ref 96–112)
Potassium: 5 mEq/L (ref 3.5–5.1)
Total Bilirubin: 0.2 mg/dL — ABNORMAL LOW (ref 0.3–1.2)

## 2012-01-02 LAB — SURGICAL PCR SCREEN
MRSA, PCR: NEGATIVE
Staphylococcus aureus: NEGATIVE

## 2012-01-02 LAB — PREPARE RBC (CROSSMATCH)

## 2012-01-02 LAB — PROTIME-INR: INR: 1.01 (ref 0.00–1.49)

## 2012-01-02 LAB — APTT: aPTT: 28 seconds (ref 24–37)

## 2012-01-02 NOTE — Progress Notes (Signed)
Stress test in epic

## 2012-01-02 NOTE — Pre-Procedure Instructions (Signed)
Dylan [name]  01/02/2012   Your procedure is scheduled on:  01/10/12  Report to [facility] at 530 AM.  Call this number if you have problems the morning of surgery: [phone]   Remember:   Do not eat food:After Midnight.   Take these medicines the morning of surgery with A SIP OF WATER: allopurinol,antara   Do not wear jewelry, make-up or nail polish.  Do not wear lotions, powders, or perfumes. You may wear deodorant.  Do not shave 48 hours prior to surgery. Men may shave face and neck.  Do not bring valuables to the hospital.  Contacts, dentures or bridgework may not be worn into surgery.  Leave suitcase in the car. After surgery it may be brought to your room.  For patients admitted to the hospital, checkout time is 11:00 AM the day of discharge.   Patients discharged the day of surgery will not be allowed to drive home.  Name and phone number of your driver: family  Special Instructions: CHG Shower Use Special Wash: 1/2 bottle night before surgery and 1/2 bottle morning of surgery.   Please read over the following fact sheets that you were given: Pain Booklet, Coughing and Deep Breathing, Blood Transfusion Information, MRSA Information and Surgical Site Infection Prevention

## 2012-01-03 ENCOUNTER — Encounter (HOSPITAL_COMMUNITY): Payer: Self-pay | Admitting: Vascular Surgery

## 2012-01-03 NOTE — Consult Note (Signed)
Anesthesia chart review: Patient is a 78 year old male scheduled for EVAR of AAA on 01/10/12 by Dr. Hart Rochester.  Other history includes DM2, chronic renal insufficiency, hyperlipidemia, former smoker, COPD, pneumonia, anemia, brain aneurysm with rupture/CVA '72, gout, hypertension, glaucoma, laparoscopic cholecystectomy on 09/10/11. PCP is Dr. Butch Penny.  EKG on 08/31/11 showed NSR.  He had a Normal stress nuclear study on 12/24/11 showing LV Ejection Fraction: 63%. LV Wall Motion: NL LV Function; NL Wall Motion.   CXR on 01/02/12 showed: Hyperinflation consistent with COPD and/or asthma. Scarring in the lower lobes. No acute cardiopulmonary disease.   Labs noted.  K+ 5.0, BUN/Cr 27/1.84, H/H 11.6/36.1, coags WNL.  T&S done.  (His Cr has been 1.79-1.87 since at least 08/31/11 and was noted in Dr. Candie Chroman office note on 12/18/11.)  Since his renal function appears stable, I will not plan to order any repeat labs on the day of surgery.  Dr. Hart Rochester can follow renal function closely in the post-operative period.  Shonna Chock, PA-C

## 2012-01-09 MED ORDER — DEXTROSE 5 % IV SOLN
1.5000 g | INTRAVENOUS | Status: AC
  Administered 2012-01-10: 1.5 g via INTRAVENOUS
  Filled 2012-01-09: qty 1.5

## 2012-01-10 ENCOUNTER — Encounter (HOSPITAL_COMMUNITY): Payer: Self-pay | Admitting: Anesthesiology

## 2012-01-10 ENCOUNTER — Inpatient Hospital Stay (HOSPITAL_COMMUNITY)
Admission: RE | Admit: 2012-01-10 | Discharge: 2012-01-11 | DRG: 238 | Disposition: A | Discharge status: Home / Self Care | Payer: PRIVATE HEALTH INSURANCE | Source: Ambulatory Visit | Attending: Vascular Surgery | Admitting: Vascular Surgery

## 2012-01-10 ENCOUNTER — Encounter (HOSPITAL_COMMUNITY): Payer: Self-pay | Admitting: Vascular Surgery

## 2012-01-10 ENCOUNTER — Inpatient Hospital Stay (HOSPITAL_COMMUNITY): Payer: PRIVATE HEALTH INSURANCE | Admitting: Vascular Surgery

## 2012-01-10 ENCOUNTER — Encounter (HOSPITAL_COMMUNITY)
Admission: RE | Disposition: A | Discharge status: Home / Self Care | Payer: Self-pay | Source: Ambulatory Visit | Attending: Vascular Surgery

## 2012-01-10 DIAGNOSIS — I714 Abdominal aortic aneurysm, without rupture, unspecified: Principal | ICD-10-CM | POA: Diagnosis present

## 2012-01-10 DIAGNOSIS — I129 Hypertensive chronic kidney disease with stage 1 through stage 4 chronic kidney disease, or unspecified chronic kidney disease: Secondary | ICD-10-CM | POA: Diagnosis present

## 2012-01-10 DIAGNOSIS — Z87891 Personal history of nicotine dependence: Secondary | ICD-10-CM

## 2012-01-10 DIAGNOSIS — E785 Hyperlipidemia, unspecified: Secondary | ICD-10-CM | POA: Diagnosis present

## 2012-01-10 DIAGNOSIS — Z79899 Other long term (current) drug therapy: Secondary | ICD-10-CM

## 2012-01-10 DIAGNOSIS — E119 Type 2 diabetes mellitus without complications: Secondary | ICD-10-CM | POA: Diagnosis present

## 2012-01-10 DIAGNOSIS — Z7982 Long term (current) use of aspirin: Secondary | ICD-10-CM

## 2012-01-10 DIAGNOSIS — N189 Chronic kidney disease, unspecified: Secondary | ICD-10-CM | POA: Diagnosis present

## 2012-01-10 DIAGNOSIS — J4489 Other specified chronic obstructive pulmonary disease: Secondary | ICD-10-CM | POA: Diagnosis present

## 2012-01-10 DIAGNOSIS — Z23 Encounter for immunization: Secondary | ICD-10-CM

## 2012-01-10 DIAGNOSIS — J449 Chronic obstructive pulmonary disease, unspecified: Secondary | ICD-10-CM | POA: Diagnosis present

## 2012-01-10 LAB — GLUCOSE, CAPILLARY
Glucose-Capillary: 137 mg/dL — ABNORMAL HIGH (ref 70–99)
Glucose-Capillary: 115 mg/dL — ABNORMAL HIGH (ref 70–99)
Glucose-Capillary: 129 mg/dL — ABNORMAL HIGH (ref 70–99)

## 2012-01-10 LAB — HEMOGLOBIN A1C: Mean Plasma Glucose: 117 mg/dL — ABNORMAL HIGH (ref <117)

## 2012-01-10 SURGERY — INSERTION, ENDOVASCULAR STENT GRAFT, AORTA, ABDOMINAL
Anesthesia: General | Wound class: Clean

## 2012-01-10 MED ORDER — FENOFIBRATE 160 MG PO TABS: 160.0000 mg | ORAL_TABLET | Freq: Every day | ORAL | Status: DC

## 2012-01-10 MED ORDER — GUAIFENESIN-DM 100-10 MG/5ML PO SYRP: 15.0000 mL | ORAL_SOLUTION | ORAL | Status: DC | PRN

## 2012-01-10 MED ORDER — ACETAMINOPHEN 650 MG RE SUPP: 325.0000 mg | RECTAL | Status: DC | PRN

## 2012-01-10 MED ORDER — HYDRALAZINE HCL 20 MG/ML IJ SOLN
10.0000 mg | INTRAMUSCULAR | Status: DC | PRN
  Administered 2012-01-10: 10 mg via INTRAVENOUS
  Filled 2012-01-10: qty 1

## 2012-01-10 MED ORDER — GLYCOPYRROLATE 0.2 MG/ML IJ SOLN
INTRAMUSCULAR | Status: DC | PRN
  Administered 2012-01-10: .2 mg via INTRAVENOUS

## 2012-01-10 MED ORDER — MIDAZOLAM HCL 5 MG/5ML IJ SOLN
INTRAMUSCULAR | Status: DC | PRN
  Administered 2012-01-10: 2 mg via INTRAVENOUS

## 2012-01-10 MED ORDER — PIOGLITAZONE HCL 30 MG PO TABS
30.0000 mg | ORAL_TABLET | Freq: Every day | ORAL | Status: DC
  Administered 2012-01-10 – 2012-01-11 (×2): 30 mg via ORAL
  Filled 2012-01-10 (×2): qty 1

## 2012-01-10 MED ORDER — SODIUM CHLORIDE 0.9 % IR SOLN
Status: DC | PRN
  Administered 2012-01-10: 08:00:00

## 2012-01-10 MED ORDER — GLIPIZIDE 10 MG PO TABS: 10.0000 mg | ORAL_TABLET | Freq: Every day | ORAL | Status: DC

## 2012-01-10 MED ORDER — ONDANSETRON HCL 4 MG/2ML IJ SOLN: 4.0000 mg | Freq: Once | INTRAMUSCULAR | Status: DC | PRN

## 2012-01-10 MED ORDER — ASPIRIN EC 81 MG PO TBEC
81.0000 mg | DELAYED_RELEASE_TABLET | Freq: Every day | ORAL | Status: DC
  Administered 2012-01-10 – 2012-01-11 (×2): 81 mg via ORAL
  Filled 2012-01-10 (×2): qty 1

## 2012-01-10 MED ORDER — PNEUMOCOCCAL VAC POLYVALENT 25 MCG/0.5ML IJ INJ
0.5000 mL | INJECTION | INTRAMUSCULAR | Status: AC
  Administered 2012-01-11: 0.5 mL via INTRAMUSCULAR
  Filled 2012-01-10: qty 0.5

## 2012-01-10 MED ORDER — ONDANSETRON HCL 4 MG/2ML IJ SOLN: 4.0000 mg | Freq: Four times a day (QID) | INTRAMUSCULAR | Status: DC | PRN

## 2012-01-10 MED ORDER — LACTATED RINGERS IV SOLN
INTRAVENOUS | Status: DC | PRN
  Administered 2012-01-10: 07:00:00 via INTRAVENOUS

## 2012-01-10 MED ORDER — INSULIN ASPART 100 UNIT/ML ~~LOC~~ SOLN: 0.0000 [IU] | Freq: Three times a day (TID) | SUBCUTANEOUS | Status: DC

## 2012-01-10 MED ORDER — HEPARIN SODIUM (PORCINE) 1000 UNIT/ML IJ SOLN
INTRAMUSCULAR | Status: DC | PRN
  Administered 2012-01-10: 6000 [IU] via INTRAVENOUS

## 2012-01-10 MED ORDER — POTASSIUM CHLORIDE CRYS ER 20 MEQ PO TBCR: 20.0000 meq | EXTENDED_RELEASE_TABLET | Freq: Once | ORAL | Status: AC | PRN

## 2012-01-10 MED ORDER — ALUM & MAG HYDROXIDE-SIMETH 200-200-20 MG/5ML PO SUSP: 15.0000 mL | ORAL | Status: DC | PRN

## 2012-01-10 MED ORDER — ASPIRIN EC 81 MG PO TBEC: 81.0000 mg | DELAYED_RELEASE_TABLET | Freq: Every day | ORAL | Status: DC

## 2012-01-10 MED ORDER — PROPOFOL 10 MG/ML IV EMUL
INTRAVENOUS | Status: DC | PRN
  Administered 2012-01-10: 110 mg via INTRAVENOUS

## 2012-01-10 MED ORDER — DOCUSATE SODIUM 100 MG PO CAPS
100.0000 mg | ORAL_CAPSULE | Freq: Every day | ORAL | Status: DC
  Administered 2012-01-11: 100 mg via ORAL
  Filled 2012-01-10: qty 1

## 2012-01-10 MED ORDER — SODIUM CHLORIDE 0.9 % IV SOLN
INTRAVENOUS | Status: DC | PRN
  Administered 2012-01-10: 09:00:00 via INTRAVENOUS

## 2012-01-10 MED ORDER — ALLOPURINOL 100 MG PO TABS: 100.0000 mg | ORAL_TABLET | Freq: Every day | ORAL | Status: DC

## 2012-01-10 MED ORDER — FENOFIBRATE 160 MG PO TABS
160.0000 mg | ORAL_TABLET | Freq: Every day | ORAL | Status: DC
  Administered 2012-01-10 – 2012-01-11 (×2): 160 mg via ORAL
  Filled 2012-01-10 (×2): qty 1

## 2012-01-10 MED ORDER — NEOSTIGMINE METHYLSULFATE 1 MG/ML IJ SOLN
INTRAMUSCULAR | Status: DC | PRN
  Administered 2012-01-10: 2 mg via INTRAVENOUS

## 2012-01-10 MED ORDER — METOPROLOL TARTRATE 1 MG/ML IV SOLN: 2.0000 mg | INTRAVENOUS | Status: DC | PRN

## 2012-01-10 MED ORDER — PHENOL 1.4 % MT LIQD: 1.0000 | OROMUCOSAL | Status: DC | PRN

## 2012-01-10 MED ORDER — HYDROMORPHONE HCL PF 1 MG/ML IJ SOLN
0.2500 mg | INTRAMUSCULAR | Status: DC | PRN
  Administered 2012-01-10: 0.5 mg via INTRAVENOUS

## 2012-01-10 MED ORDER — ALLOPURINOL 100 MG PO TABS
100.0000 mg | ORAL_TABLET | Freq: Every day | ORAL | Status: DC
  Administered 2012-01-10 – 2012-01-11 (×2): 100 mg via ORAL
  Filled 2012-01-10 (×2): qty 1

## 2012-01-10 MED ORDER — ASPIRIN 81 MG PO TABS: 81.0000 mg | ORAL_TABLET | Freq: Every day | ORAL | Status: DC

## 2012-01-10 MED ORDER — PIOGLITAZONE HCL 30 MG PO TABS: 30.0000 mg | ORAL_TABLET | Freq: Every day | ORAL | Status: DC

## 2012-01-10 MED ORDER — PROTAMINE SULFATE 10 MG/ML IV SOLN
INTRAVENOUS | Status: DC | PRN
  Administered 2012-01-10: 50 mg via INTRAVENOUS

## 2012-01-10 MED ORDER — DOCUSATE SODIUM 100 MG PO CAPS: 100.0000 mg | ORAL_CAPSULE | Freq: Every day | ORAL | Status: DC

## 2012-01-10 MED ORDER — HYDROMORPHONE HCL PF 1 MG/ML IJ SOLN
INTRAMUSCULAR | Status: AC
  Filled 2012-01-10: qty 1

## 2012-01-10 MED ORDER — ACETAMINOPHEN 325 MG PO TABS: 325.0000 mg | ORAL_TABLET | ORAL | Status: DC | PRN

## 2012-01-10 MED ORDER — ROCURONIUM BROMIDE 100 MG/10ML IV SOLN
INTRAVENOUS | Status: DC | PRN
  Administered 2012-01-10: 50 mg via INTRAVENOUS

## 2012-01-10 MED ORDER — LABETALOL HCL 5 MG/ML IV SOLN: 10.0000 mg | INTRAVENOUS | Status: DC | PRN

## 2012-01-10 MED ORDER — PANTOPRAZOLE SODIUM 40 MG PO TBEC
40.0000 mg | DELAYED_RELEASE_TABLET | Freq: Every day | ORAL | Status: DC
  Administered 2012-01-10 – 2012-01-11 (×2): 40 mg via ORAL
  Filled 2012-01-10 (×2): qty 1

## 2012-01-10 MED ORDER — DEXTROSE 5 % IV SOLN
1.5000 g | Freq: Two times a day (BID) | INTRAVENOUS | Status: AC
  Administered 2012-01-10 – 2012-01-11 (×2): 1.5 g via INTRAVENOUS
  Filled 2012-01-10 (×2): qty 1.5

## 2012-01-10 MED ORDER — SODIUM CHLORIDE 0.9 % IV SOLN
INTRAVENOUS | Status: DC
  Administered 2012-01-10: 100 mL/h via INTRAVENOUS

## 2012-01-10 MED ORDER — MAGNESIUM SULFATE 40 MG/ML IJ SOLN
2.0000 g | Freq: Once | INTRAMUSCULAR | Status: AC | PRN
  Filled 2012-01-10: qty 50

## 2012-01-10 MED ORDER — IODIXANOL 320 MG/ML IV SOLN
INTRAVENOUS | Status: DC | PRN
  Administered 2012-01-10: 50 mL via INTRAVENOUS

## 2012-01-10 MED ORDER — 0.9 % SODIUM CHLORIDE (POUR BTL) OPTIME
TOPICAL | Status: DC | PRN
  Administered 2012-01-10: 1000 mL

## 2012-01-10 MED ORDER — LIDOCAINE HCL (CARDIAC) 20 MG/ML IV SOLN
INTRAVENOUS | Status: DC | PRN
  Administered 2012-01-10: 100 mg via INTRAVENOUS

## 2012-01-10 MED ORDER — ONDANSETRON HCL 4 MG/2ML IJ SOLN
INTRAMUSCULAR | Status: DC | PRN
  Administered 2012-01-10: 4 mg via INTRAVENOUS

## 2012-01-10 MED ORDER — LABETALOL HCL 5 MG/ML IV SOLN
INTRAVENOUS | Status: DC | PRN
  Administered 2012-01-10 (×3): 5 mg via INTRAVENOUS

## 2012-01-10 MED ORDER — FENTANYL CITRATE 0.05 MG/ML IJ SOLN
INTRAMUSCULAR | Status: DC | PRN
  Administered 2012-01-10: 100 ug via INTRAVENOUS
  Administered 2012-01-10: 50 ug via INTRAVENOUS
  Administered 2012-01-10: 100 ug via INTRAVENOUS
  Administered 2012-01-10 (×2): 50 ug via INTRAVENOUS

## 2012-01-10 MED ORDER — GLIPIZIDE 10 MG PO TABS
10.0000 mg | ORAL_TABLET | Freq: Every day | ORAL | Status: DC
  Administered 2012-01-10 – 2012-01-11 (×2): 10 mg via ORAL
  Filled 2012-01-10 (×2): qty 1

## 2012-01-10 MED ORDER — INSULIN ASPART 100 UNIT/ML ~~LOC~~ SOLN
0.0000 [IU] | Freq: Three times a day (TID) | SUBCUTANEOUS | Status: DC
  Administered 2012-01-10: 2 [IU] via SUBCUTANEOUS

## 2012-01-10 MED ORDER — SODIUM CHLORIDE 0.9 % IV SOLN: INTRAVENOUS | Status: DC

## 2012-01-10 MED ORDER — OXYCODONE-ACETAMINOPHEN 5-325 MG PO TABS: 1.0000 | ORAL_TABLET | ORAL | Status: AC | PRN

## 2012-01-10 MED ORDER — SODIUM CHLORIDE 0.9 % IV SOLN: 500.0000 mL | Freq: Once | INTRAVENOUS | Status: AC | PRN

## 2012-01-10 MED ORDER — ATORVASTATIN CALCIUM 40 MG PO TABS
40.0000 mg | ORAL_TABLET | Freq: Every day | ORAL | Status: DC
  Administered 2012-01-10: 40 mg via ORAL
  Filled 2012-01-10 (×2): qty 1

## 2012-01-10 SURGICAL SUPPLY — 81 items
BAG BANDED W/RUBBER/TAPE 36X54 (MISCELLANEOUS) ×2 IMPLANT
BAG SNAP BAND KOVER 36X36 (MISCELLANEOUS) ×2 IMPLANT
BALLN CODA OCL 2-9.0-35-120-3 (BALLOONS)
BALLOON COD OCL 2-9.0-35-120-3 (BALLOONS) IMPLANT
CANISTER SUCTION 2500CC (MISCELLANEOUS) ×2 IMPLANT
CATH BEACON 5.038 65CM KMP-01 (CATHETERS) ×2 IMPLANT
CATH OMNI FLUSH .035X70CM (CATHETERS) ×2 IMPLANT
CLIP TI MEDIUM 24 (CLIP) IMPLANT
CLIP TI WIDE RED SMALL 24 (CLIP) IMPLANT
CLOTH BEACON ORANGE TIMEOUT ST (SAFETY) ×2 IMPLANT
COVER DOME SNAP 22 D (MISCELLANEOUS) ×2 IMPLANT
COVER MAYO STAND STRL (DRAPES) ×2 IMPLANT
COVER SURGICAL LIGHT HANDLE (MISCELLANEOUS) ×2 IMPLANT
DERMABOND ADVANCED (GAUZE/BANDAGES/DRESSINGS) ×2
DERMABOND ADVANCED .7 DNX12 (GAUZE/BANDAGES/DRESSINGS) ×2 IMPLANT
DEVICE CLOSURE PERCLS PRGLD 6F (VASCULAR PRODUCTS) ×4 IMPLANT
DEVICE TORQUE H2O (MISCELLANEOUS) ×2 IMPLANT
DRAIN CHANNEL 10F 3/8 F FF (DRAIN) IMPLANT
DRAIN CHANNEL 10M FLAT 3/4 FLT (DRAIN) IMPLANT
DRAPE TABLE COVER HEAVY DUTY (DRAPES) ×2 IMPLANT
DRYSEAL FLEXSHEATH 12FR 33CM (SHEATH) ×1
DRYSEAL FLEXSHEATH 18FR 33CM (SHEATH) ×1
ELECT CAUTERY BLADE 6.4 (BLADE) IMPLANT
ELECT REM PT RETURN 9FT ADLT (ELECTROSURGICAL) ×4
ELECTRODE REM PT RTRN 9FT ADLT (ELECTROSURGICAL) ×2 IMPLANT
EVACUATOR 3/16  PVC DRAIN (DRAIN)
EVACUATOR 3/16 PVC DRAIN (DRAIN) IMPLANT
EVACUATOR SILICONE 100CC (DRAIN) IMPLANT
EXCLUDER TRUNK (Endovascular Graft) ×2 IMPLANT
GAUZE SPONGE 4X4 16PLY XRAY LF (GAUZE/BANDAGES/DRESSINGS) ×2 IMPLANT
GLOVE BIOGEL PI IND STRL 6.5 (GLOVE) ×1 IMPLANT
GLOVE BIOGEL PI IND STRL 7.5 (GLOVE) ×1 IMPLANT
GLOVE BIOGEL PI INDICATOR 6.5 (GLOVE) ×1
GLOVE BIOGEL PI INDICATOR 7.5 (GLOVE) ×1
GLOVE ECLIPSE 6.5 STRL STRAW (GLOVE) ×2 IMPLANT
GLOVE ECLIPSE 7.5 STRL STRAW (GLOVE) ×2 IMPLANT
GLOVE SS BIOGEL STRL SZ 7 (GLOVE) ×1 IMPLANT
GLOVE SS N UNI LF 7.5 STRL (GLOVE) ×2 IMPLANT
GLOVE SUPERSENSE BIOGEL SZ 7 (GLOVE) ×1
GOWN EXTRA PROTECTION XL (GOWNS) ×2 IMPLANT
GOWN STRL NON-REIN LRG LVL3 (GOWN DISPOSABLE) ×8 IMPLANT
GRAFT BALLN CATH 65CM (STENTS) ×1 IMPLANT
GUIDEWIRE ANGLED .035X150CM (WIRE) ×2 IMPLANT
KIT BASIN OR (CUSTOM PROCEDURE TRAY) ×2 IMPLANT
KIT ROOM TURNOVER OR (KITS) ×2 IMPLANT
LEG CONTRALATERAL 16X16X9.5 (Endovascular Graft) ×2 IMPLANT
NEEDLE PERC 18GX7CM (NEEDLE) ×2 IMPLANT
NS IRRIG 1000ML POUR BTL (IV SOLUTION) ×4 IMPLANT
PACK AORTA (CUSTOM PROCEDURE TRAY) ×2 IMPLANT
PAD ARMBOARD 7.5X6 YLW CONV (MISCELLANEOUS) ×4 IMPLANT
PENCIL BUTTON HOLSTER BLD 10FT (ELECTRODE) IMPLANT
PERCLOSE PROGLIDE 6F (VASCULAR PRODUCTS) ×8
PROTECTION STATION ×2 IMPLANT
SHEATH AVANTI 11CM 8FR (MISCELLANEOUS) ×2 IMPLANT
SHEATH BRITE TIP 8FR 23CM (MISCELLANEOUS) ×2 IMPLANT
SHEATH DRYSEAL FLEX 12FR 33CM (SHEATH) ×1 IMPLANT
SHEATH DRYSEAL FLEX 18FR 33CM (SHEATH) ×1 IMPLANT
SLEEVE SURGEON STRL (DRAPES) ×2 IMPLANT
STENT GRAFT BALLN CATH 65CM (STENTS) ×1
STOPCOCK MORSE 400PSI 3WAY (MISCELLANEOUS) ×2 IMPLANT
SUT ETHILON 3 0 PS 1 (SUTURE) IMPLANT
SUT PROLENE 5 0 C 1 24 (SUTURE) IMPLANT
SUT PROLENE 5 0 CC 1 (SUTURE) IMPLANT
SUT PROLENE 6 0 C 1 30 (SUTURE) IMPLANT
SUT VIC AB 2-0 CT1 27 (SUTURE)
SUT VIC AB 2-0 CT1 TAPERPNT 27 (SUTURE) IMPLANT
SUT VIC AB 3-0 SH 27 (SUTURE)
SUT VIC AB 3-0 SH 27X BRD (SUTURE) IMPLANT
SUT VICRYL 4-0 PS2 18IN ABS (SUTURE) ×4 IMPLANT
SYR 20CC LL (SYRINGE) ×4 IMPLANT
SYR 30ML LL (SYRINGE) IMPLANT
SYR 5ML LL (SYRINGE) IMPLANT
SYR MEDRAD MARK V 150ML (SYRINGE) ×2 IMPLANT
SYRINGE 10CC LL (SYRINGE) ×4 IMPLANT
TOWEL OR 17X24 6PK STRL BLUE (TOWEL DISPOSABLE) ×4 IMPLANT
TOWEL OR 17X26 10 PK STRL BLUE (TOWEL DISPOSABLE) ×4 IMPLANT
TRAY FOLEY CATH 14FRSI W/METER (CATHETERS) ×2 IMPLANT
TUBING HIGH PRESSURE 120CM (CONNECTOR) ×2 IMPLANT
WATER STERILE IRR 1000ML POUR (IV SOLUTION) ×2 IMPLANT
WIRE AMPLATZ SS-J .035X180CM (WIRE) ×4 IMPLANT
WIRE BENTSON .035X145CM (WIRE) ×4 IMPLANT

## 2012-01-10 NOTE — Op Note (Signed)
OPERATIVE REPORT  Date of Surgery: 01/10/2012  Surgeon: Josephina Gip, MD  Assistant: Clearence Ped Pre-op Diagnosis: Abdominal Aortic Aneurysm  Post-op Diagnosis: Abdominal Aortic Aneurysm  Procedure: Procedure(s): #1 bilateral femoral artery percutaneous access with closure using Pro-glide #2 insertion of aortobifemoral iliac gore excluder-C3 stent graft     A--23 x 14 x 14 cm main body via left common femoral approach     B--16 x 9.5 cm contralateral limb via a right common femoral approach #3 completion angiography Anesthesia: General  EBL: 100 cc  Complications: None  Procedure Details: Patient was taken to the operating room placed in the supine position at which time satisfactory general endotracheal anesthesia was administered. Monitoring lines were placed the anesthesia living and radial arterial line and a sheath in the internal jugular vein. Abdomen and groins were prepped with Betadine scrub and solution draped in routine sterile manner. Using ultrasound guidance bilateral common femoral artery percutaneous approach was utilized using the Pro-glide device at the 11:00 and 1:00 position bilaterally for subsequent closure. After this was achieved a short 8 [place] sheath was passed over a Bentson wire in the left side to the common femoral artery and a long 8 French sheath into the right common femoral artery over a Bentson wire. Using a Kumpe catheter wire was exchanged for bilateral Amplatz superstiff wires. A #12 French sheath was placed on the right and a #18 French sheath placed on the left. The patient was heparinized prior to placing the sheaths. The main body was deployed through the left femoral approach and was positioned at the level of the infrarenal neck. Using a pigtail catheter a 10 cc injection was made to localize the exact position of the renal arteries. The main body was then deployed just distal to the left renal artery which is the lowest of the 2. Subsequent  angiogram with 10 cc of contrast confirmed excellent location. Graft was deployed down to the contralateral gate. Using the chief via the right common femoral artery and a Kumpe catheter the gate was cannulated the Bentson wire. A retrograde angiogram of the right iliac system was then performed through the sheath injecting 10 cc of contrast localizing the level of the hypogastric origin. Bentson wire was exchanged for an Amplatz wire and a 16 x 9.5 cm contralateral limb was selected and deployed appropriately. The remainder of the main body was then deployed down the left iliac system. All injections were dilated with the Q-50 balloon catheter. Completion angiogram was then performed injecting 20 cc of contrast at 20 cc per second and this revealed excellent placement of the graft with no evidence of endoleak. Amplatz wires were then exchanged for Bentson wires and the sheaths removed with closure performed by the tube Pro-glide closure device is utilized on each femoral artery one at the 11:00 position one at the 1:00 position after this had been achieved an adequate hemostasis was present protamine was given to reverse the heparin when his were closed with Vicryl in subcuticular fashion sterile dressing applied patient taken to recovery in stable condition at Urinary output was stable hemodynamically and had minimal blood loss approximately 100 cc  Josephina Gip, MD 01/10/2012 9:57 AM

## 2012-01-10 NOTE — Anesthesia Postprocedure Evaluation (Signed)
Anesthesia Post Note  Patient: Dylan [name]  Procedure(s) Performed: Procedure(s) (LRB): ABDOMINAL AORTIC ENDOVASCULAR STENT GRAFT (N/A)  Anesthesia type: general  Patient location: PACU  Post pain: Pain level controlled  Post assessment: Patient's Cardiovascular Status Stable  Last Vitals:  Filed Vitals:   01/10/12 1045  BP:   Pulse: 50  Temp:   Resp: 10    Post vital signs: Reviewed and stable  Level of consciousness: sedated  Complications: No apparent anesthesia complications

## 2012-01-10 NOTE — H&P (View-Only) (Signed)
Subjective:     Patient ID: Dylan [name], male   DOB: 1931-05-18, 78 y.o.   MRN: [id number]  HPI this 78 year old male returned today to discuss further his abdominal aortic aneurysm. CT scan without contrast was obtained today. I reviewed this by computer. His baseline creatinine is 1.86 the GFR of 36 . Patient has no history of coronary artery disease. He has had significant enlargement aneurysm which now measures a maximum of 5.6 cm in diameter.  Past Medical History  Diagnosis Date  . Hyperlipidemia   . Diabetes mellitus     Type II  . Cholelithiasis     which has resolved, apparently  . Hypertension   . Anemia     diffuse  . COPD (chronic obstructive pulmonary disease)   . Pneumonia   . AAA (abdominal aortic aneurysm) 02/11/07  . Stroke 1972    CVA  . Glaucoma   . Gout   . Brain aneurysm     hx of ruptured aneurysm in 70's    History  Substance Use Topics  . Smoking status: Former Smoker -- 1.0 packs/day for 25 years    Types: Cigarettes    Quit date: 05/21/1968  . Smokeless tobacco: Former Neurosurgeon    Quit date: 12/18/1971  . Alcohol Use: 0.5 oz/week    1 drink(s) per week     rare    Family History  Problem Relation Age of Onset  . Coronary artery disease Brother   . Anesthesia problems Neg Hx   . Hypotension Neg Hx   . Malignant hyperthermia Neg Hx   . Pseudochol deficiency Neg Hx   . Cancer Mother     BREAST    No Known Allergies  Current outpatient prescriptions:allopurinol (ZYLOPRIM) 100 MG tablet, Take 100 mg by mouth daily., Disp: , Rfl: ;  aspirin 81 MG tablet, Take 81 mg by mouth daily., Disp: , Rfl: ;  fenofibrate micronized (ANTARA) 130 MG capsule, Take 130 mg by mouth daily., Disp: , Rfl: ;  fish oil-omega-3 fatty acids 1000 MG capsule, Take 1,000 mg by mouth daily., Disp: , Rfl: ;  glipiZIDE (GLUCOTROL) 10 MG tablet, Take 10 mg by mouth daily., Disp: , Rfl:  naproxen sodium (ALEVE) 220 MG tablet, Take 220 mg by mouth 2 (two) times daily with a meal.  Pain, Disp: , Rfl: ;  pioglitazone (ACTOS) 30 MG tablet, Take 30 mg by mouth daily., Disp: , Rfl: ;  rosuvastatin (CRESTOR) 20 MG tablet, Take 20 mg by mouth daily., Disp: , Rfl:   BP 195/90  Pulse 81  Resp 20  Ht 5\' 9"  (1.753 m)  Wt 207 lb (93.895 kg)  BMI 30.57 kg/m2  Body mass index is 30.57 kg/(m^2).          Review of Systems denies chest pain, dyspnea on exertion, PND, orthopnea, hemoptysis, claudication,     Objective:   Physical Exam blood pressure 195/90 heart rate 81 respirations 20 Gen.-alert and oriented x3 in no apparent distress HEENT normal for age Lungs no rhonchi or wheezing Cardiovascular regular rhythm no murmurs carotid pulses 3+ palpable no bruits audible Abdomen soft nontender -5 cm pulsatile mass-nontender  Musculoskeletal free of  major deformities Skin clear -no rashes Neurologic normal Lower extremities 3+ femoral and posterior tibial pulses palpable bilaterally with no edema  Today I reviewed his CT scan by computer. He does appear to be an adequate candidate for aortic stent grafting. The only concern is the small diameter of the terminal aorta.  We feel that this can be treated with laser and angioplasty satisfactorily.     Assessment:     Enlarging infrarenal abdominal aortic aneurysm-thought to be candidate for aortic stent grafting Need cardiac clearance will obtain Cardiolite    Plan:     #1 Will schedule Cardiolite exam #2 plan endovascular repair of AAA using the Gore endograft on a Wednesday, September 4  Risks of renal failure and cardiac issues discussed as well as technical problems related to terminal aorta with need for open repair Family would like to proceed with attempt at

## 2012-01-10 NOTE — Transfer of Care (Signed)
Immediate Anesthesia Transfer of Care Note  Patient: Dylan [name]  Procedure(s) Performed: Procedure(s) (LRB): ABDOMINAL AORTIC ENDOVASCULAR STENT GRAFT (N/A)  Patient Location: PACU  Anesthesia Type: General  Level of Consciousness: awake, alert  and oriented  Airway & Oxygen Therapy: Patient Spontanous Breathing and Patient connected to face mask oxygen  Post-op Assessment: Report given to PACU RN  Post vital signs: Reviewed and stable  Complications: No apparent anesthesia complications

## 2012-01-10 NOTE — Preoperative (Signed)
Beta Blockers   Reason not to administer Beta Blockers:Not Applicable 

## 2012-01-10 NOTE — Interval H&P Note (Signed)
History and Physical Interval Note:  01/10/2012 7:37 AM  Dylan [name]  has presented today for surgery, with the diagnosis of Abdominal Aortic Aneurysm  The various methods of treatment have been discussed with the patient and family. After consideration of risks, benefits and other options for treatment, the patient has consented to  Procedure(s) (LRB): ABDOMINAL AORTIC ENDOVASCULAR STENT GRAFT (N/A) as a surgical intervention .  The patient's history has been reviewed, patient examined, no change in status, stable for surgery.  I have reviewed the patient's chart and labs.  Questions were answered to the patient's satisfaction.     Josephina Gip

## 2012-01-10 NOTE — Discharge Summary (Signed)
[facility] Discharge Summary   Patient ID:  Dylan [name] MRN: [id number] DOB/AGE: [birth year] 78 y.o.  Admit date: 01/10/2012 Discharge date: 01/10/2012 Date of Surgery: 01/10/2012 Surgeon: Surgeon(s): Pryor Ochoa, MD Nada Libman, MD  Admission Diagnosis: Abdominal Aortic Aneurysm  Discharge Diagnoses:  Abdominal Aortic Aneurysm  Secondary Diagnoses: Past Medical History  Diagnosis Date  . Hyperlipidemia   . Diabetes mellitus     Type II  . Cholelithiasis     which has resolved, apparently  . Anemia     diffuse  . COPD (chronic obstructive pulmonary disease)   . Pneumonia   . AAA (abdominal aortic aneurysm) 02/11/07  . Stroke 1972    CVA  . Glaucoma   . Gout   . Brain aneurysm     hx of ruptured aneurysm in 70's  . Hypertension     dr Cornelia Copa     . Chronic kidney disease     chronic renal insufficiency    Procedure(s): ABDOMINAL AORTIC ENDOVASCULAR STENT GRAFT  Discharged Condition: good  HPI: Dylan [name] is a 78 y.o. male   Who has a known abdominal aortic aneurysm. CT scan without contrast was obtained today. I reviewed this by computer. His baseline creatinine is 1.86 the GFR of 36 . Patient has no history of coronary artery disease. He has had significant enlargement aneurysm which now measures a maximum of 5.6 cm in diameter. Enlarging infrarenal abdominal aortic aneurysm-thought to be candidate for aortic stent grafting. Pt had cardiac w/u prior to surgery   Hospital Course:  Dylan [name] is a 78 y.o. male is S/P  Procedure(s): ABDOMINAL AORTIC ENDOVASCULAR STENT GRAFT Extubated: POD # 0 Post-op wounds healing well Pt. Ambulating, voiding and taking PO diet without difficulty. Pt pain controlled with PO pain meds. Labs as below Complications:none  Consults:     Significant Diagnostic Studies: CBC Lab Results  Component Value Date   WBC 6.0 01/02/2012   HGB 11.6* 01/02/2012   HCT 36.1* 01/02/2012   MCV 97.3  01/02/2012   PLT 185 01/02/2012    BMET    Component Value Date/Time   NA 138 01/02/2012 1119   K 5.0 01/02/2012 1119   CL 105 01/02/2012 1119   CO2 26 01/02/2012 1119   GLUCOSE 91 01/02/2012 1119   BUN 27* 01/02/2012 1119   CREATININE 1.84* 01/02/2012 1119   CREATININE 1.87* 12/13/2011 1434   CALCIUM 9.2 01/02/2012 1119   GFRNONAA 33* 01/02/2012 1119   GFRAA 38* 01/02/2012 1119   COAG Lab Results  Component Value Date   INR 1.01 01/02/2012     Disposition:  Discharge to :Home Discharge Orders    Future Appointments: Provider: Department: Dept Phone: Center:   02/26/2012 9:30 AM [facility] [phone] [facility]   02/26/2012 10:00 AM Evern Bio, NP [facility] [phone] [facility]     Future Orders Please Complete By Expires   Resume previous diet      Driving Restrictions      Comments:   No driving for 4 weeks   Lifting restrictions      Comments:   No lifting for 6 weeks   Call MD for:  temperature >100.5      Call MD for:  redness, tenderness, or signs of infection (pain, swelling, bleeding, redness, odor or green/yellow discharge around incision site)      Call MD for:  severe or increased pain, loss or decreased feeling  in affected limb(s)  Increase activity slowly      Comments:   Walk with assistance use walker or cane as needed   May shower       Scheduling Instructions:   Saturday   may wash over wound with mild soap and water      Remove dressing in 24 hours      ABDOMINAL PROCEDURE/ANEURYSM REPAIR/AORTO-BIFEMORAL BYPASS:  Call MD for increased abdominal pain; cramping diarrhea; nausea/vomiting         Dylan [name], Dylan [name]  Home Medication Instructions [id number]   Printed on:01/10/12 1017  Medication Information                    rosuvastatin (CRESTOR) 20 MG tablet Take 20 mg by mouth daily.           pioglitazone (ACTOS) 30 MG tablet Take 30 mg by mouth daily.           allopurinol (ZYLOPRIM) 100 MG tablet Take 100 mg by mouth  daily.           aspirin 81 MG tablet Take 81 mg by mouth daily.           fish oil-omega-3 fatty acids 1000 MG capsule Take 1,000 mg by mouth 2 (two) times daily.            glipiZIDE (GLUCOTROL) 10 MG tablet Take 10 mg by mouth daily.           naproxen sodium (ALEVE) 220 MG tablet Take 220 mg by mouth 2 (two) times daily with a meal. Pain           fenofibrate micronized (ANTARA) 130 MG capsule Take 130 mg by mouth daily.           oxyCODONE-acetaminophen (ROXICET) 5-325 MG per tablet Take 1 tablet by mouth every 4 (four) hours as needed for pain.            Verbal and written Discharge instructions given to the patient. Wound care per Discharge AVS   Signed: Marlowe Shores 01/10/2012, 10:17 AM

## 2012-01-10 NOTE — OR Nursing (Signed)
Flouro Time 13.3 and 1982 mGy

## 2012-01-10 NOTE — Anesthesia Preprocedure Evaluation (Addendum)
Anesthesia Evaluation  Patient identified by MRN, date of birth, ID band Patient awake    Reviewed: Allergy & Precautions, H&P , NPO status , Patient's Chart, lab work & pertinent test results  Airway Mallampati: II TM Distance: >3 FB Neck ROM: Full    Dental  (+) Edentulous Upper and Edentulous Lower   Pulmonary COPD   Pulmonary exam normal       Cardiovascular hypertension, Pt. on medications     Neuro/Psych CVA, No Residual Symptoms    GI/Hepatic   Endo/Other  Type 2  Renal/GU CRFRenal disease     Musculoskeletal   Abdominal Normal abdominal exam  (+)   Peds  Hematology   Anesthesia Other Findings   Reproductive/Obstetrics                          Anesthesia Physical Anesthesia Plan  ASA: III  Anesthesia Plan: General   Post-op Pain Management:    Induction: Intravenous  Airway Management Planned: Oral ETT  Additional Equipment: Arterial line and CVP  Intra-op Plan:   Post-operative Plan: Extubation in OR and Possible Post-op intubation/ventilation  Informed Consent: I have reviewed the patients History and Physical, chart, labs and discussed the procedure including the risks, benefits and alternatives for the proposed anesthesia with the patient or authorized representative who has indicated his/her understanding and acceptance.   Dental advisory given  Plan Discussed with: CRNA and Surgeon  Anesthesia Plan Comments:        Anesthesia Quick Evaluation

## 2012-01-10 NOTE — Progress Notes (Addendum)
After dangling the patient and placing back to bed patient's blood pressure was 193/86. Gave the patient hydralazine 10mg  IV. Rechecking blood pressure every 15 minutes for awhile.Nnow at 5:30PM rechecked blood pressure and down to 150/74.

## 2012-01-10 NOTE — Progress Notes (Signed)
Utilization review completed.  

## 2012-01-10 NOTE — Progress Notes (Signed)
Patient received from PACU alittle drowsy but oriented. Vitals are stable. Bilateral groin sites are at level 0, alittle drainage. Patient denies pain. Patient has been oriented to unit and understands to call for nurse. Patient's sisters at bedside. Patient's wife is at [employer] having a procedure, patients daughter there at present.

## 2012-01-11 ENCOUNTER — Telehealth: Payer: Self-pay | Admitting: Vascular Surgery

## 2012-01-11 ENCOUNTER — Other Ambulatory Visit: Payer: Self-pay | Admitting: *Deleted

## 2012-01-11 ENCOUNTER — Encounter (HOSPITAL_COMMUNITY): Payer: Self-pay | Admitting: *Deleted

## 2012-01-11 DIAGNOSIS — Z48812 Encounter for surgical aftercare following surgery on the circulatory system: Secondary | ICD-10-CM

## 2012-01-11 DIAGNOSIS — I714 Abdominal aortic aneurysm, without rupture: Secondary | ICD-10-CM

## 2012-01-11 LAB — TYPE AND SCREEN
ABO/RH(D): A POS
Unit division: 0
Unit division: 0

## 2012-01-11 LAB — CBC
MCV: 97.2 fL (ref 78.0–100.0)
Platelets: 169 10*3/uL (ref 150–400)
RBC: 3.16 MIL/uL — ABNORMAL LOW (ref 4.22–5.81)
RDW: 14.3 % (ref 11.5–15.5)
WBC: 7.8 10*3/uL (ref 4.0–10.5)

## 2012-01-11 LAB — BASIC METABOLIC PANEL
CO2: 22 mEq/L (ref 19–32)
Chloride: 109 mEq/L (ref 96–112)
GFR calc Af Amer: 43 mL/min — ABNORMAL LOW (ref >90)
Potassium: 3.8 mEq/L (ref 3.5–5.1)
Sodium: 140 mEq/L (ref 135–145)

## 2012-01-11 NOTE — Progress Notes (Signed)
[facility] [place]  Post-op EVAR Date of Surgery: 01/10/2012 Surgeon: Surgeon(s): Pryor Ochoa, MD Nada Libman, MD POD: 1 Day Post-Op Device: insertion of aortobifemoral iliac gore excluder-C3 stent graft  A--23 x 14 x 14 cm main body via left common femoral approach  B--16 x 9.5 cm contralateral limb via a right common femoral approach  Endoleak: No  History of Present Illness  Dylan [name] is a 78 y.o. male who is s/p EVAR. The patient denies back pain; denies abdominal pain; denies lower extremity pain.  He is Ambulating and taking PO well without nausea or vomiting. Pt. has not yet voided with foley out  IMAGING: No results found.  Significant Diagnostic Studies: CBC Lab Results  Component Value Date   WBC 7.8 01/11/2012   HGB 9.8* 01/11/2012   HCT 30.7* 01/11/2012   MCV 97.2 01/11/2012   PLT 169 01/11/2012     BMET    Component Value Date/Time   NA 140 01/11/2012 0500   K 3.8 01/11/2012 0500   CL 109 01/11/2012 0500   CO2 22 01/11/2012 0500   GLUCOSE 90 01/11/2012 0500   BUN 19 01/11/2012 0500   CREATININE 1.68* 01/11/2012 0500   CREATININE 1.87* 12/13/2011 1434   CALCIUM 8.6 01/11/2012 0500   GFRNONAA 37* 01/11/2012 0500   GFRAA 43* 01/11/2012 0500    COAG Lab Results  Component Value Date   INR 1.01 01/02/2012   No results found for this basename: PTT     I/O last 3 completed shifts: In: 3478.3 [P.O.:300; I.V.:3128.3; IV Piggyback:50] Out: 1550 [Urine:1350; Blood:200] No data found.   Physical Examination  BP Readings from Last 3 Encounters:  01/10/12 140/62  01/10/12 140/62  01/02/12 174/71   Temp Readings from Last 3 Encounters:  01/11/12 98.3 F (36.8 C) Oral  01/11/12 98.3 F (36.8 C) Oral  01/02/12 98.3 F (36.8 C)    SpO2 Readings from Last 3 Encounters:  01/10/12 95%  01/10/12 95%  01/02/12 97%   Pulse Readings from Last 3 Encounters:  01/10/12 71  01/10/12 71  01/02/12 77    General: A&O x 3, WDWN  male in NAD Gait: Normal Pulmonary: normal non-labored breathing  Cardiac: RRR Abdomen: soft, NT, NABS Bilateral groin wounds: clean, dry, intact, without hematoma Vascular Exam/Pulses:palp PT on Right, palp DP on left  Extremities without ischemic changes, no Gangrene , no cellulitis; no open wounds;   Neurologic: A&O X 3; Appropriate Affect  Assessment: Dylan [name] is a 78 y.o. male who is 1 Day Post-Op EVAR.  Pt is doing well with no complaints  Plan: Home after voids  The importance of surveillance of the endograft was discussed with the patient  A CTA of abdomen and pelvis will be scheduled for one month to assess for endoleak.  The patient will follow up with [place] in one month with these studies.   SignedMarlowe Shores [phone] 01/11/2012 7:26 AM.

## 2012-01-11 NOTE — Progress Notes (Signed)
Discharge instructions reviewed with pt's daughter, these included; home medications, s/s to report to MD, follow up appointments, diet, activity, and wound care.  Verbal understanding from daughter noted.  Pt condition unchanged from am assessment.

## 2012-01-11 NOTE — Discharge Summary (Signed)
Agree with above. The patient was seen and examined.  His groin incisions are soft.  He is tolerating his diet and is pain free.  OK to discharge  Durene Cal

## 2012-01-11 NOTE — Telephone Encounter (Signed)
Message copied by Fredrich Birks on Fri Jan 11, 2012  3:19 PM ------      Message from: Sharee Pimple      Created: Fri Jan 11, 2012  8:21 AM      Regarding: schedule                   ----- Message -----         From: Melene Plan, RN         Sent: 01/10/2012  12:55 PM           To: Sharee Pimple, CMA, [facility]                        ----- Message -----         From: Marlowe Shores, PA         Sent: 01/10/2012  10:13 AM           To: Melene Plan, RN            4 week F/U EVAR AAA - [facility] - he will need CTA abd/pelvis

## 2012-01-11 NOTE — Progress Notes (Signed)
Right IJ dc'ed.  Pt instructed to remain supine with HOB lowered until 1120.  Tolerated well, and tip of catheter intact upon removal.  Will be dc'ed home soon.

## 2012-01-11 NOTE — Telephone Encounter (Signed)
Scheduled, sent letter and lvm for pt to call for instructions. Will need to repeat lab due to elevated BUN and Creatnine

## 2012-02-06 ENCOUNTER — Other Ambulatory Visit: Payer: Self-pay | Admitting: Vascular Surgery

## 2012-02-07 LAB — CREATININE, SERUM: Creat: 1.92 mg/dL — ABNORMAL HIGH (ref 0.50–1.35)

## 2012-02-11 ENCOUNTER — Encounter: Payer: Self-pay | Admitting: Vascular Surgery

## 2012-02-11 ENCOUNTER — Other Ambulatory Visit: Payer: Self-pay | Admitting: *Deleted

## 2012-02-11 DIAGNOSIS — I739 Peripheral vascular disease, unspecified: Secondary | ICD-10-CM

## 2012-02-12 ENCOUNTER — Other Ambulatory Visit: Payer: Self-pay | Admitting: Vascular Surgery

## 2012-02-12 ENCOUNTER — Ambulatory Visit
Admission: RE | Admit: 2012-02-12 | Discharge: 2012-02-12 | Disposition: A | Discharge status: Home / Self Care | Payer: PRIVATE HEALTH INSURANCE | Source: Ambulatory Visit | Attending: Vascular Surgery | Admitting: Vascular Surgery

## 2012-02-12 ENCOUNTER — Encounter: Payer: Self-pay | Admitting: Vascular Surgery

## 2012-02-12 ENCOUNTER — Ambulatory Visit (INDEPENDENT_AMBULATORY_CARE_PROVIDER_SITE_OTHER): Payer: PRIVATE HEALTH INSURANCE | Admitting: Vascular Surgery

## 2012-02-12 VITALS — BP 196/77 | HR 65 | Resp 20 | Ht 69.0 in | Wt 207.0 lb

## 2012-02-12 DIAGNOSIS — I714 Abdominal aortic aneurysm, without rupture, unspecified: Secondary | ICD-10-CM

## 2012-02-12 DIAGNOSIS — Z48812 Encounter for surgical aftercare following surgery on the circulatory system: Secondary | ICD-10-CM

## 2012-02-12 LAB — CREATININE, SERUM: Creat: 1.7 mg/dL — ABNORMAL HIGH (ref 0.50–1.35)

## 2012-02-12 LAB — BUN: BUN: 27 mg/dL — ABNORMAL HIGH (ref 6–23)

## 2012-02-12 MED ORDER — IOHEXOL 350 MG/ML SOLN
50.0000 mL | Freq: Once | INTRAVENOUS | Status: AC | PRN
  Administered 2012-02-12: 50 mL via INTRAVENOUS

## 2012-02-12 NOTE — Progress Notes (Signed)
Subjective:     Patient ID: Dylan [name], male   DOB: November 08, 1930, 78 y.o.   MRN: [id number]  HPI this 78 year old male returns 4 weeks post insertion of a Gore excluder-C3 skin graft-aorto by common iliac for -5.6 cm abdominal aortic aneurysm. Patient denies any abdominal or back pains since discharge. He denies claudication symptoms. He has had no chest pain or tightness or hemoptysis. He is anxious to resume his normal activities.  Review of Systems     Objective:   Physical ExamBP 196/77  Pulse 65  Resp 20  Ht 5\' 9"  (1.753 m)  Wt 207 lb (93.895 kg)  BMI 30.57 kg/m2  General well-developed well-nourished male in no apparent distress Lungs no rhonchi or wheezing Cardiovascular regular rhythm no murmurs Abdomen soft no pulsatile mass noted 3+ femoral 2+ dorsalis pedis pulses palpable  Today I ordered a CT angiogram which are reviewed by computer. Aneurysm sac is approximately the same as at the time of surgery roughly 5.6 cm. No type I endoleak are noted. There might be a small type II endoleak from a lumbar branch.     Assessment:     Doing well 4 weeks post EVAR abdominal aortic aneurysm-Gore-excluder-C3 graft Possible small type II endoleak    Plan:     Return in 6 months with CT angiogram for continued followup

## 2012-02-26 ENCOUNTER — Ambulatory Visit: Payer: PRIVATE HEALTH INSURANCE | Admitting: Neurosurgery

## 2012-08-07 ENCOUNTER — Other Ambulatory Visit: Payer: Self-pay | Admitting: Vascular Surgery

## 2012-08-07 LAB — CREATININE, SERUM: Creat: 1.85 mg/dL — ABNORMAL HIGH (ref 0.50–1.35)

## 2012-08-11 ENCOUNTER — Encounter: Payer: Self-pay | Admitting: Vascular Surgery

## 2012-08-12 ENCOUNTER — Ambulatory Visit
Admission: RE | Admit: 2012-08-12 | Discharge: 2012-08-12 | Disposition: A | Discharge status: Home / Self Care | Payer: PRIVATE HEALTH INSURANCE | Source: Ambulatory Visit | Attending: Vascular Surgery | Admitting: Vascular Surgery

## 2012-08-12 ENCOUNTER — Ambulatory Visit (INDEPENDENT_AMBULATORY_CARE_PROVIDER_SITE_OTHER): Payer: PRIVATE HEALTH INSURANCE | Admitting: Vascular Surgery

## 2012-08-12 ENCOUNTER — Encounter: Payer: Self-pay | Admitting: Vascular Surgery

## 2012-08-12 VITALS — BP 165/77 | HR 64 | Resp 16 | Ht 69.0 in | Wt 205.0 lb

## 2012-08-12 DIAGNOSIS — Z48812 Encounter for surgical aftercare following surgery on the circulatory system: Secondary | ICD-10-CM

## 2012-08-12 DIAGNOSIS — I714 Abdominal aortic aneurysm, without rupture: Secondary | ICD-10-CM

## 2012-08-12 MED ORDER — IOHEXOL 350 MG/ML SOLN
80.0000 mL | Freq: Once | INTRAVENOUS | Status: AC | PRN
  Administered 2012-08-12: 80 mL via INTRAVENOUS

## 2012-08-12 NOTE — Addendum Note (Signed)
Addended by: Adria Dill L on: 08/12/2012 04:50 PM   Modules accepted: Orders

## 2012-08-12 NOTE — Progress Notes (Signed)
Subjective:     Patient ID: Dylan [name], male   DOB: 03-15-1931, 79 y.o.   MRN: [id number]  HPIthis 79 year old male returns 7 months post endovascular aneurysm repair using a Gore-C3-endograft. Patient denies any abdominal back symptoms. He is maintaining his normal activity level no specific complaints.  Past Medical History  Diagnosis Date  . Hyperlipidemia   . Diabetes mellitus     Type II  . Cholelithiasis     which has resolved, apparently  . Anemia     diffuse  . COPD (chronic obstructive pulmonary disease)   . Pneumonia   . AAA (abdominal aortic aneurysm) 02/11/07  . Stroke 1972    CVA  . Glaucoma(365)   . Gout   . Brain aneurysm     hx of ruptured aneurysm in 70's  . Hypertension     dr Cornelia Copa     . Chronic kidney disease     chronic renal insufficiency    History  Substance Use Topics  . Smoking status: Former Smoker -- 1.00 packs/day for 25 years    Types: Cigarettes    Quit date: 05/21/1968  . Smokeless tobacco: Former Neurosurgeon    Quit date: 12/18/1971  . Alcohol Use: 0.5 oz/week    1 drink(s) per week     Comment: rare    Family History  Problem Relation Age of Onset  . Coronary artery disease Brother   . Anesthesia problems Neg Hx   . Hypotension Neg Hx   . Malignant hyperthermia Neg Hx   . Pseudochol deficiency Neg Hx   . Cancer Mother     BREAST    No Known Allergies  Current outpatient prescriptions:allopurinol (ZYLOPRIM) 100 MG tablet, Take 100 mg by mouth daily., Disp: , Rfl: ;  aspirin 81 MG tablet, Take 81 mg by mouth daily., Disp: , Rfl: ;  fenofibrate micronized (ANTARA) 130 MG capsule, Take 130 mg by mouth daily., Disp: , Rfl: ;  fish oil-omega-3 fatty acids 1000 MG capsule, Take 1,000 mg by mouth 2 (two) times daily. , Disp: , Rfl:  glipiZIDE (GLUCOTROL) 10 MG tablet, Take 10 mg by mouth daily., Disp: , Rfl: ;  levothyroxine (SYNTHROID, LEVOTHROID) 25 MCG tablet, Take 25 mcg by mouth daily., Disp: , Rfl: ;  naproxen sodium (ALEVE) 220 MG  tablet, Take 220 mg by mouth 2 (two) times daily with a meal. Pain, Disp: , Rfl: ;  pioglitazone (ACTOS) 30 MG tablet, Take 30 mg by mouth daily., Disp: , Rfl: ;  rosuvastatin (CRESTOR) 20 MG tablet, Take 20 mg by mouth daily., Disp: , Rfl:   BP 165/77  Pulse 64  Resp 16  Ht 5\' 9"  (1.753 m)  Wt 205 lb (92.987 kg)  BMI 30.26 kg/m2  Body mass index is 30.26 kg/(m^2).           Review of Systems Denies chest pain, dyspnea on exertion, PND, orthopnea, hemoptysis, lateralizing weakness, aphasia, amaurosis fugax      Objective:   Physical Exam blood pressure 165/77 heart rate 64 respirations 16 Gen.-alert and oriented x3 in no apparent distress HEENT normal for age Lungs no rhonchi or wheezing Cardiovascular regular rhythm no murmurs carotid pulses 3+ palpable no bruits audible Abdomen soft nontender no palpable masses-Obese Musculoskeletal free of  major deformities Skin clear -no rashes Neurologic normal Lower extremities 3+ femoral and dorsalis pedis pulses palpable bilaterally with no edema  Today I ordered a CT angiogram which are reviewed by computer. Patient continues to have  an endoleak which appears to be arising from a right distal lumbar arterial branch and possibly the eye and a is definitely a type II leak. Of concern is the fact that the aneurysm sac today measures 6.4 cm in maximum diameter compared to previous 6.1 cm dimension   Also noted is a small lesion at the inferior pole of the left kidney-renal cell carcinoma cannot be excluded the radiologist but it has not enlarged in the past 9 months        Assessment:     7 months status post EVAR of AAA with type II endoleak-aneurysm sac has slightly enlarged since last study 6 months ago the current size of 6.4 cm maximum diameter Small lesion lower pole left kidney-renal cell carcinoma not excluded     Plan:     Will leave evaluation of renal lesion up to Dr. Renard Matter. I discussed this with patient and her  daughter who are aware and they will check with Dr. Megan Mans regarding further evaluation We'll have patient return in 3 months for repeat CT angiogram to check size of aneurysm and look at endoleak-if continues enlargement of aneurysm sac may need intervention by interventional radiologist

## 2012-10-11 IMAGING — CR DG LUMBAR SPINE COMPLETE 4+V
5 series · 5 of 5 positions shown · non-contrast
Comparison: None.

CLINICAL DATA: Fell with pain

LUMBAR SPINE - COMPLETE 4+ VIEW

[view not recorded (1 of 5)]
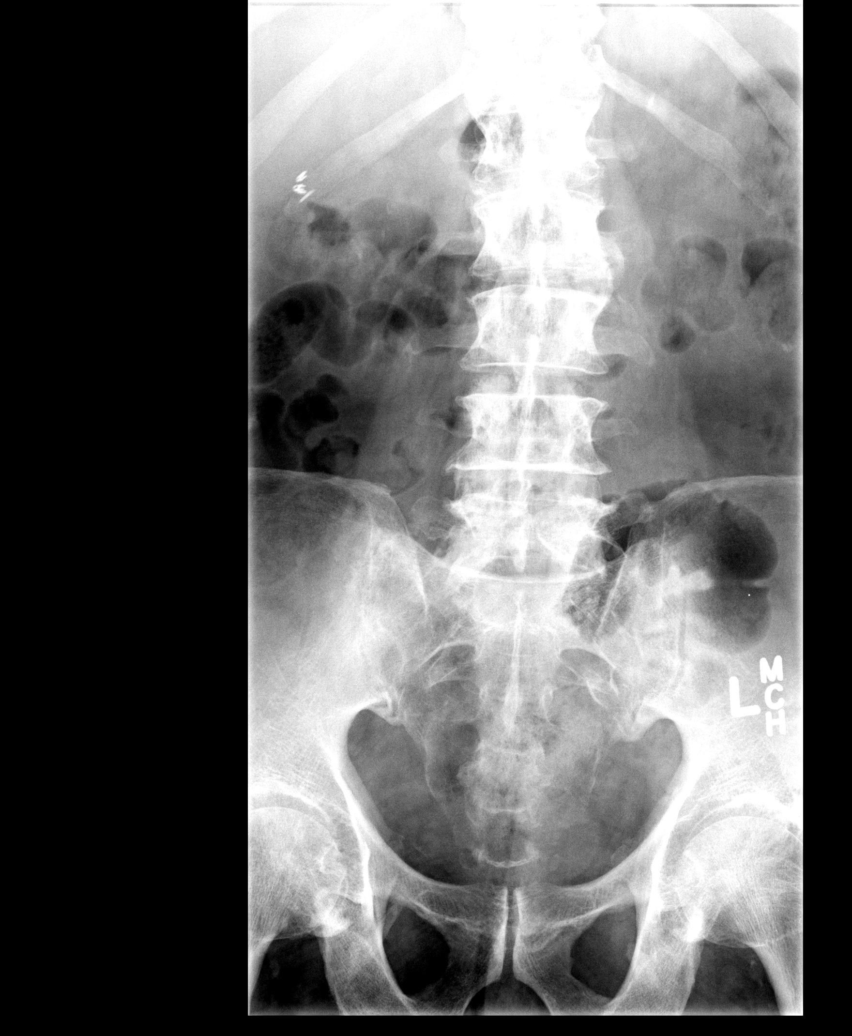

[view not recorded (2 of 5)]
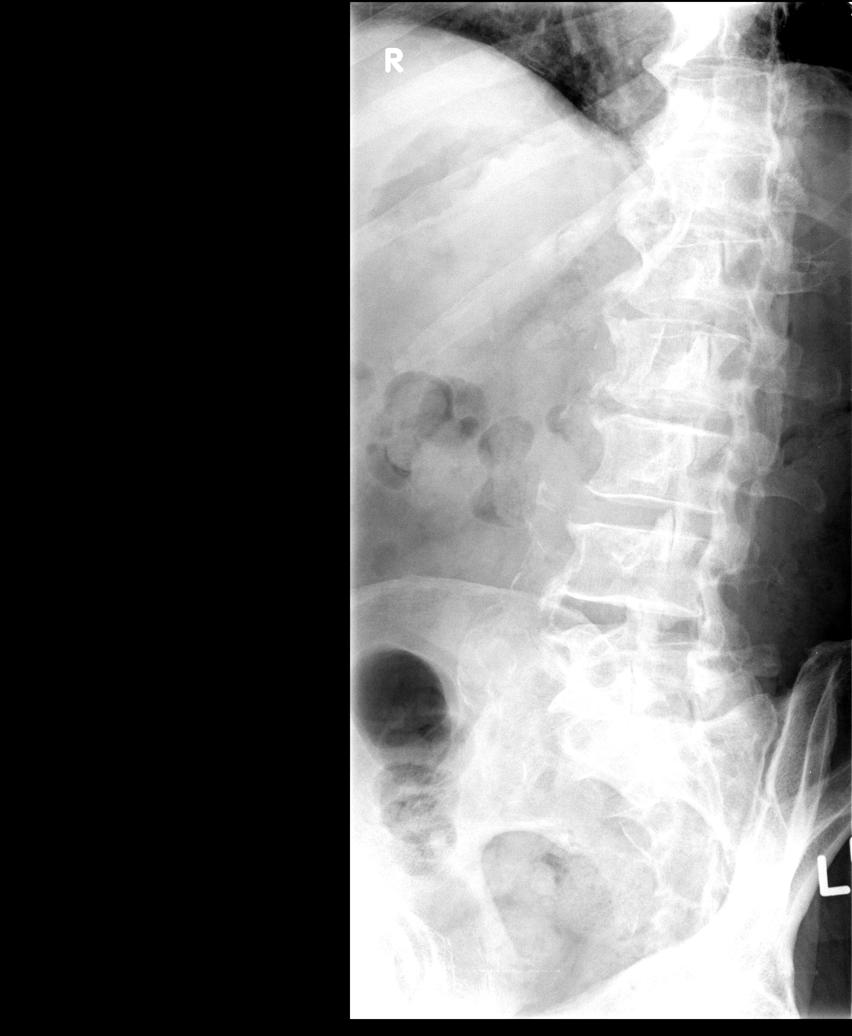

[view not recorded (3 of 5)]
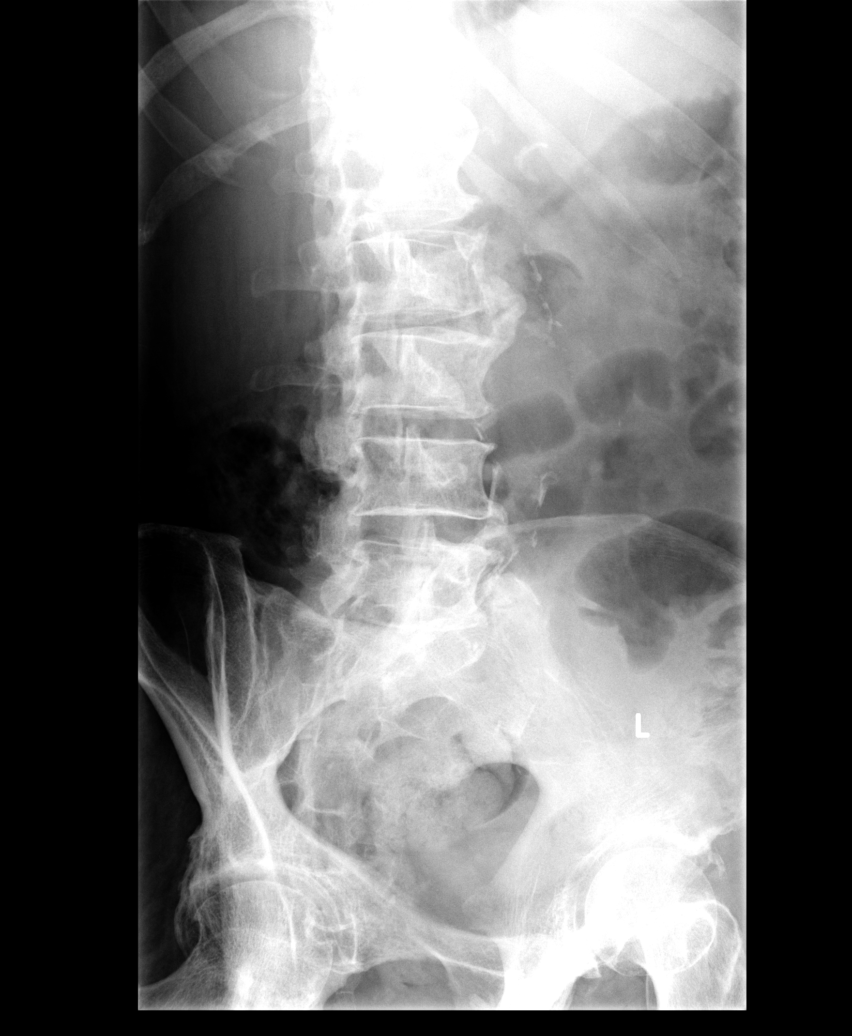

[view not recorded (4 of 5)]
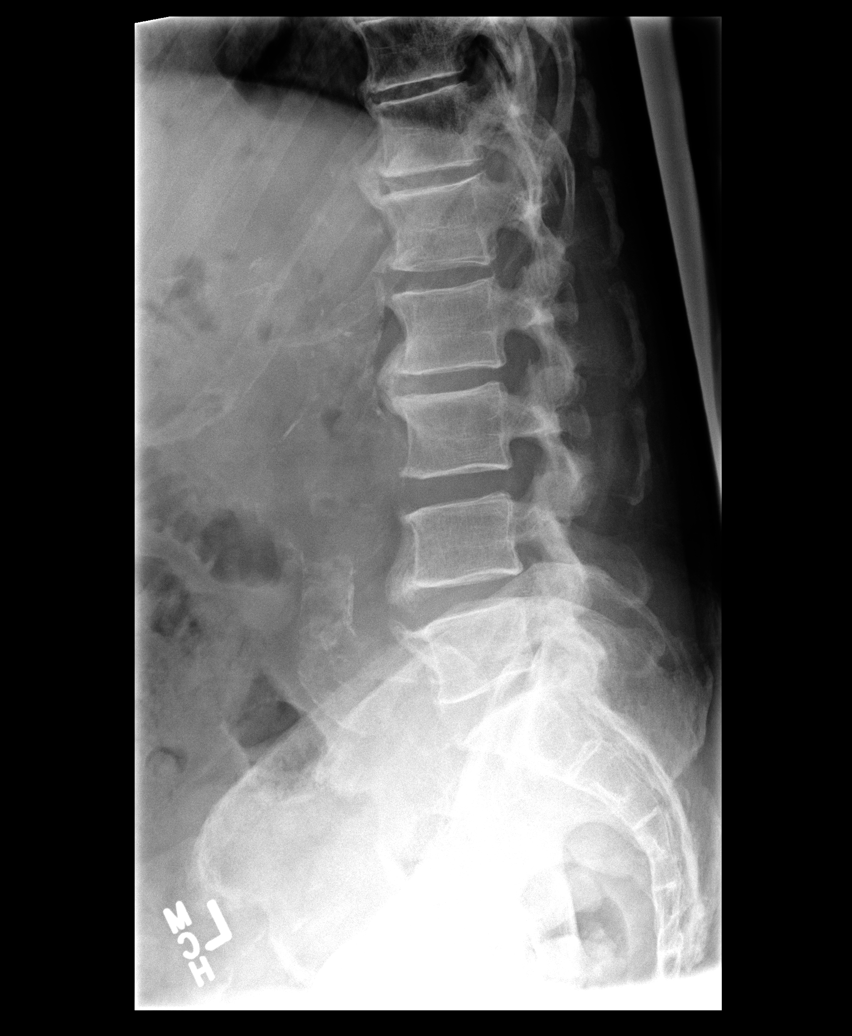

[view not recorded (5 of 5)]
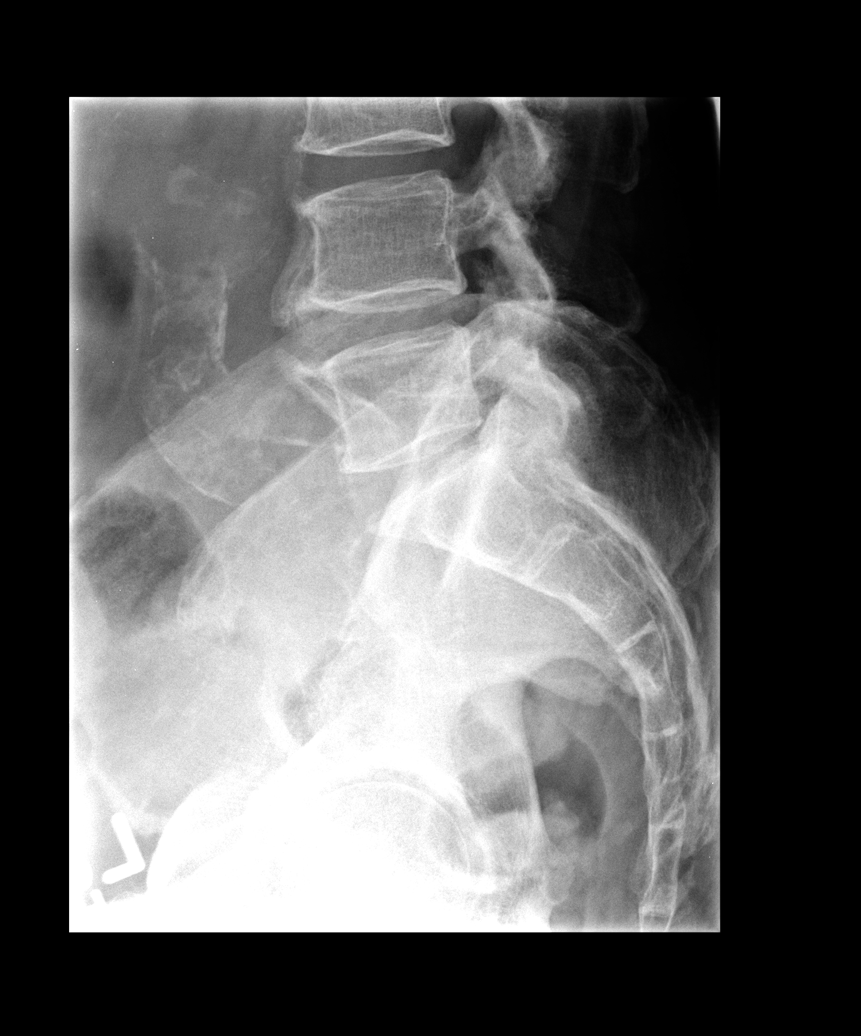

[5 of 5 positions shown; findings below may reference images not displayed]

FINDINGS: There are five lumbar-type vertebral bodies.  No
significant malalignment.  No fracture in the region from upper T11
through the sacrum.  No significant disc space narrowing.  There
are ordinary bridging osteophytes.  There is lower lumbar facet
degeneration.  Sacroiliac joints shows some osteoarthritis as well.
There is atherosclerosis of the aorta.  There may be any aneurysm
on the order of 5.6 cm.
IMPRESSION: No acute finding.  No visible fracture.  Ordinary mild degenerative
changes.

Possible aortic aneurysm.  This [DATE] cm in diameter.  Has
this been evaluated?

## 2012-10-31 ENCOUNTER — Other Ambulatory Visit: Payer: Self-pay | Admitting: Vascular Surgery

## 2012-10-31 LAB — BUN: BUN: 30 mg/dL — ABNORMAL HIGH (ref 6–23)

## 2012-11-03 ENCOUNTER — Encounter: Payer: Self-pay | Admitting: Vascular Surgery

## 2012-11-04 ENCOUNTER — Ambulatory Visit: Payer: PRIVATE HEALTH INSURANCE | Admitting: Vascular Surgery

## 2012-11-04 ENCOUNTER — Ambulatory Visit
Admission: RE | Admit: 2012-11-04 | Discharge: 2012-11-04 | Disposition: A | Discharge status: Home / Self Care | Payer: PRIVATE HEALTH INSURANCE | Source: Ambulatory Visit | Attending: Vascular Surgery | Admitting: Vascular Surgery

## 2012-11-04 ENCOUNTER — Ambulatory Visit (INDEPENDENT_AMBULATORY_CARE_PROVIDER_SITE_OTHER): Payer: PRIVATE HEALTH INSURANCE | Admitting: Vascular Surgery

## 2012-11-04 ENCOUNTER — Encounter: Payer: Self-pay | Admitting: Vascular Surgery

## 2012-11-04 VITALS — BP 169/72 | HR 65 | Resp 18 | Ht 69.0 in | Wt 215.0 lb

## 2012-11-04 DIAGNOSIS — Z48812 Encounter for surgical aftercare following surgery on the circulatory system: Secondary | ICD-10-CM

## 2012-11-04 DIAGNOSIS — I714 Abdominal aortic aneurysm, without rupture, unspecified: Secondary | ICD-10-CM

## 2012-11-04 MED ORDER — IOHEXOL 350 MG/ML SOLN
60.0000 mL | Freq: Once | INTRAVENOUS | Status: AC | PRN
  Administered 2012-11-04: 60 mL via INTRAVENOUS

## 2012-11-04 NOTE — Progress Notes (Signed)
Subjective:     Patient ID: Dylan [name], male   DOB: 30-Jan-1931, 79 y.o.   MRN: [id number]  HPI this 79 year old male returns for continued followup regarding his aortic stent graft placed August of 2013 for an abdominal aortic aneurysm. He is known to have a small endoleak type II which we have been following. The aneurysm sac has slightly enlarged in the past to about 6.4 cm. Today a CT angiogram was performed. The patient denies any new symptoms.  Past Medical History  Diagnosis Date  . Hyperlipidemia   . Diabetes mellitus     Type II  . Cholelithiasis     which has resolved, apparently  . Anemia     diffuse  . COPD (chronic obstructive pulmonary disease)   . Pneumonia   . AAA (abdominal aortic aneurysm) 02/11/07  . Stroke 1972    CVA  . Glaucoma   . Gout   . Brain aneurysm     hx of ruptured aneurysm in 70's  . Hypertension     dr Cornelia Copa     . Chronic kidney disease     chronic renal insufficiency    History  Substance Use Topics  . Smoking status: Former Smoker -- 1.00 packs/day for 25 years    Types: Cigarettes    Quit date: 05/21/1968  . Smokeless tobacco: Former Neurosurgeon    Quit date: 12/18/1971  . Alcohol Use: 0.5 oz/week    1 drink(s) per week     Comment: rare    Family History  Problem Relation Age of Onset  . Coronary artery disease Brother   . Anesthesia problems Neg Hx   . Hypotension Neg Hx   . Malignant hyperthermia Neg Hx   . Pseudochol deficiency Neg Hx   . Cancer Mother     BREAST    No Known Allergies  Current outpatient prescriptions:allopurinol (ZYLOPRIM) 100 MG tablet, Take 100 mg by mouth daily., Disp: , Rfl: ;  aspirin 81 MG tablet, Take 81 mg by mouth daily., Disp: , Rfl: ;  fenofibrate micronized (ANTARA) 130 MG capsule, Take 130 mg by mouth daily., Disp: , Rfl: ;  fish oil-omega-3 fatty acids 1000 MG capsule, Take 1,000 mg by mouth 2 (two) times daily. , Disp: , Rfl:  glipiZIDE (GLUCOTROL) 10 MG tablet, Take 10 mg by mouth daily.,  Disp: , Rfl: ;  levothyroxine (SYNTHROID, LEVOTHROID) 25 MCG tablet, Take 25 mcg by mouth daily., Disp: , Rfl: ;  naproxen sodium (ALEVE) 220 MG tablet, Take 220 mg by mouth 2 (two) times daily with a meal. Pain, Disp: , Rfl: ;  pioglitazone (ACTOS) 30 MG tablet, Take 30 mg by mouth daily., Disp: , Rfl: ;  rosuvastatin (CRESTOR) 20 MG tablet, Take 20 mg by mouth daily., Disp: , Rfl:   BP 169/72  Pulse 65  Resp 18  Ht 5\' 9"  (1.753 m)  Wt 215 lb (97.523 kg)  BMI 31.74 kg/m2  Body mass index is 31.74 kg/(m^2).          Review of Systems denies chest pain, dyspnea on exertion, PND, orthopnea, hemoptysis. Does complain of swelling in legs     Objective:   Physical Exam blood pressure 169/72 heart rate 65 respirations 18 General well-developed well-nourished male in no apparent stress and oriented x3 Lungs no rhonchi or wheezing Abdomen obese no palpable masses noted 3+ femoral and distal pulses noted.  Today I ordered a CT angiogram which I reviewed a computer. A compared to the  previous study 3 months ago. I do not appreciate any significant change in the diameter of the sac which were remains about 6.4 cm. The endoleak is more difficult to see today but less contrast was utilized.    Assessment:     Status post endovascular aneurysm stent graft repair with type II endoleak and slight enlargement in diameter of sac    Plan:     We'll continue close surveillance-return in 6 months with CT angiogram of abdomen and pelvis.

## 2012-11-05 ENCOUNTER — Other Ambulatory Visit: Payer: Self-pay | Admitting: *Deleted

## 2012-11-05 DIAGNOSIS — I714 Abdominal aortic aneurysm, without rupture: Secondary | ICD-10-CM

## 2012-11-05 DIAGNOSIS — Z48812 Encounter for surgical aftercare following surgery on the circulatory system: Secondary | ICD-10-CM

## 2013-04-12 ENCOUNTER — Emergency Department (HOSPITAL_COMMUNITY): Payer: PRIVATE HEALTH INSURANCE

## 2013-04-12 ENCOUNTER — Encounter (HOSPITAL_COMMUNITY): Payer: Self-pay | Admitting: Emergency Medicine

## 2013-04-12 ENCOUNTER — Inpatient Hospital Stay (HOSPITAL_COMMUNITY)
Admission: EM | Admit: 2013-04-12 | Discharge: 2013-04-22 | DRG: 064 | Disposition: A | Discharge status: Rehabilitation Facility | Payer: PRIVATE HEALTH INSURANCE | Attending: Family Medicine | Admitting: Family Medicine

## 2013-04-12 DIAGNOSIS — I6350 Cerebral infarction due to unspecified occlusion or stenosis of unspecified cerebral artery: Principal | ICD-10-CM | POA: Diagnosis present

## 2013-04-12 DIAGNOSIS — G459 Transient cerebral ischemic attack, unspecified: Secondary | ICD-10-CM

## 2013-04-12 DIAGNOSIS — N183 Chronic kidney disease, stage 3 unspecified: Secondary | ICD-10-CM | POA: Diagnosis present

## 2013-04-12 DIAGNOSIS — S22089A Unspecified fracture of T11-T12 vertebra, initial encounter for closed fracture: Secondary | ICD-10-CM

## 2013-04-12 DIAGNOSIS — E1159 Type 2 diabetes mellitus with other circulatory complications: Secondary | ICD-10-CM | POA: Diagnosis present

## 2013-04-12 DIAGNOSIS — J449 Chronic obstructive pulmonary disease, unspecified: Secondary | ICD-10-CM | POA: Diagnosis present

## 2013-04-12 DIAGNOSIS — R296 Repeated falls: Secondary | ICD-10-CM | POA: Diagnosis present

## 2013-04-12 DIAGNOSIS — I714 Abdominal aortic aneurysm, without rupture, unspecified: Secondary | ICD-10-CM

## 2013-04-12 DIAGNOSIS — Z9181 History of falling: Secondary | ICD-10-CM

## 2013-04-12 DIAGNOSIS — F10239 Alcohol dependence with withdrawal, unspecified: Secondary | ICD-10-CM | POA: Diagnosis present

## 2013-04-12 DIAGNOSIS — R410 Disorientation, unspecified: Secondary | ICD-10-CM

## 2013-04-12 DIAGNOSIS — G9340 Encephalopathy, unspecified: Secondary | ICD-10-CM | POA: Diagnosis present

## 2013-04-12 DIAGNOSIS — F1020 Alcohol dependence, uncomplicated: Secondary | ICD-10-CM | POA: Diagnosis present

## 2013-04-12 DIAGNOSIS — J4489 Other specified chronic obstructive pulmonary disease: Secondary | ICD-10-CM | POA: Diagnosis present

## 2013-04-12 DIAGNOSIS — E876 Hypokalemia: Secondary | ICD-10-CM | POA: Diagnosis present

## 2013-04-12 DIAGNOSIS — F10939 Alcohol use, unspecified with withdrawal, unspecified: Secondary | ICD-10-CM | POA: Diagnosis present

## 2013-04-12 DIAGNOSIS — W19XXXA Unspecified fall, initial encounter: Secondary | ICD-10-CM

## 2013-04-12 DIAGNOSIS — N179 Acute kidney failure, unspecified: Secondary | ICD-10-CM | POA: Diagnosis present

## 2013-04-12 DIAGNOSIS — E785 Hyperlipidemia, unspecified: Secondary | ICD-10-CM | POA: Diagnosis present

## 2013-04-12 DIAGNOSIS — F4321 Adjustment disorder with depressed mood: Secondary | ICD-10-CM | POA: Diagnosis present

## 2013-04-12 DIAGNOSIS — F1010 Alcohol abuse, uncomplicated: Secondary | ICD-10-CM

## 2013-04-12 DIAGNOSIS — R4182 Altered mental status, unspecified: Secondary | ICD-10-CM

## 2013-04-12 DIAGNOSIS — I129 Hypertensive chronic kidney disease with stage 1 through stage 4 chronic kidney disease, or unspecified chronic kidney disease: Secondary | ICD-10-CM | POA: Diagnosis present

## 2013-04-12 DIAGNOSIS — S22009A Unspecified fracture of unspecified thoracic vertebra, initial encounter for closed fracture: Secondary | ICD-10-CM | POA: Diagnosis present

## 2013-04-12 DIAGNOSIS — Z8673 Personal history of transient ischemic attack (TIA), and cerebral infarction without residual deficits: Secondary | ICD-10-CM

## 2013-04-12 DIAGNOSIS — Z66 Do not resuscitate: Secondary | ICD-10-CM | POA: Diagnosis not present

## 2013-04-12 DIAGNOSIS — I639 Cerebral infarction, unspecified: Secondary | ICD-10-CM

## 2013-04-12 DIAGNOSIS — E119 Type 2 diabetes mellitus without complications: Secondary | ICD-10-CM | POA: Diagnosis present

## 2013-04-12 DIAGNOSIS — E871 Hypo-osmolality and hyponatremia: Secondary | ICD-10-CM | POA: Diagnosis present

## 2013-04-12 DIAGNOSIS — Z87891 Personal history of nicotine dependence: Secondary | ICD-10-CM

## 2013-04-12 LAB — COMPREHENSIVE METABOLIC PANEL
ALT: 14 U/L (ref 0–53)
AST: 26 U/L (ref 0–37)
Albumin: 3.3 g/dL — ABNORMAL LOW (ref 3.5–5.2)
Alkaline Phosphatase: 53 U/L (ref 39–117)
BUN: 43 mg/dL — ABNORMAL HIGH (ref 6–23)
CO2: 25 mEq/L (ref 19–32)
Calcium: 9.5 mg/dL (ref 8.4–10.5)
Chloride: 91 mEq/L — ABNORMAL LOW (ref 96–112)
Creatinine, Ser: 2.92 mg/dL — ABNORMAL HIGH (ref 0.50–1.35)
GFR calc Af Amer: 22 mL/min — ABNORMAL LOW (ref >90)
Sodium: 131 mEq/L — ABNORMAL LOW (ref 135–145)

## 2013-04-12 LAB — GLUCOSE, CAPILLARY

## 2013-04-12 LAB — URINALYSIS, ROUTINE W REFLEX MICROSCOPIC
Bilirubin Urine: NEGATIVE
Glucose, UA: 250 mg/dL — AB
Ketones, ur: NEGATIVE mg/dL
Leukocytes, UA: NEGATIVE
Nitrite: NEGATIVE
Protein, ur: 30 mg/dL — AB
Specific Gravity, Urine: 1.011 (ref 1.005–1.030)
Urobilinogen, UA: 0.2 mg/dL (ref 0.0–1.0)
pH: 5.5 (ref 5.0–8.0)

## 2013-04-12 LAB — CBC WITH DIFFERENTIAL/PLATELET
Basophils Absolute: 0 10*3/uL (ref 0.0–0.1)
Basophils Relative: 0 % (ref 0–1)
Eosinophils Absolute: 0.1 10*3/uL (ref 0.0–0.7)
Eosinophils Relative: 1 % (ref 0–5)
HCT: 33.6 % — ABNORMAL LOW (ref 39.0–52.0)
Hemoglobin: 11.5 g/dL — ABNORMAL LOW (ref 13.0–17.0)
Lymphocytes Relative: 10 % — ABNORMAL LOW (ref 12–46)
Lymphs Abs: 0.7 10*3/uL (ref 0.7–4.0)
MCH: 35.1 pg — ABNORMAL HIGH (ref 26.0–34.0)
MCHC: 34.2 g/dL (ref 30.0–36.0)
MCV: 102.4 fL — ABNORMAL HIGH (ref 78.0–100.0)
Monocytes Absolute: 0.4 10*3/uL (ref 0.1–1.0)
Monocytes Relative: 7 % (ref 3–12)
Neutro Abs: 5.5 10*3/uL (ref 1.7–7.7)
Neutrophils Relative %: 82 % — ABNORMAL HIGH (ref 43–77)
Platelets: 147 10*3/uL — ABNORMAL LOW (ref 150–400)
RBC: 3.28 MIL/uL — ABNORMAL LOW (ref 4.22–5.81)
RDW: 14.1 % (ref 11.5–15.5)
WBC: 6.6 10*3/uL (ref 4.0–10.5)

## 2013-04-12 LAB — TROPONIN I: Troponin I: <0.3 ng/mL (ref <0.30)

## 2013-04-12 LAB — URINE MICROSCOPIC-ADD ON

## 2013-04-12 NOTE — ED Notes (Signed)
Ortho Tech paged for TLSO brace.  They will call appropriate person for fitting.

## 2013-04-12 NOTE — ED Notes (Signed)
Pt's CBG is 168 mg/dl.RN Tammy Sours.notified.

## 2013-04-12 NOTE — ED Notes (Signed)
Attempted IV unable to x2 Paged IV team
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
## 2013-04-12 NOTE — ED Notes (Signed)
EKG completed
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
## 2013-04-13 ENCOUNTER — Encounter (HOSPITAL_COMMUNITY): Payer: Self-pay | Admitting: Internal Medicine

## 2013-04-13 ENCOUNTER — Inpatient Hospital Stay (HOSPITAL_COMMUNITY): Payer: PRIVATE HEALTH INSURANCE

## 2013-04-13 DIAGNOSIS — E876 Hypokalemia: Secondary | ICD-10-CM

## 2013-04-13 DIAGNOSIS — I059 Rheumatic mitral valve disease, unspecified: Secondary | ICD-10-CM

## 2013-04-13 DIAGNOSIS — E871 Hypo-osmolality and hyponatremia: Secondary | ICD-10-CM

## 2013-04-13 DIAGNOSIS — R4182 Altered mental status, unspecified: Secondary | ICD-10-CM

## 2013-04-13 DIAGNOSIS — E119 Type 2 diabetes mellitus without complications: Secondary | ICD-10-CM | POA: Diagnosis present

## 2013-04-13 DIAGNOSIS — F1010 Alcohol abuse, uncomplicated: Secondary | ICD-10-CM | POA: Diagnosis present

## 2013-04-13 DIAGNOSIS — S22009A Unspecified fracture of unspecified thoracic vertebra, initial encounter for closed fracture: Secondary | ICD-10-CM

## 2013-04-13 DIAGNOSIS — N189 Chronic kidney disease, unspecified: Secondary | ICD-10-CM | POA: Diagnosis present

## 2013-04-13 DIAGNOSIS — F29 Unspecified psychosis not due to a substance or known physiological condition: Secondary | ICD-10-CM

## 2013-04-13 DIAGNOSIS — I6350 Cerebral infarction due to unspecified occlusion or stenosis of unspecified cerebral artery: Principal | ICD-10-CM

## 2013-04-13 DIAGNOSIS — N179 Acute kidney failure, unspecified: Secondary | ICD-10-CM

## 2013-04-13 DIAGNOSIS — G459 Transient cerebral ischemic attack, unspecified: Secondary | ICD-10-CM

## 2013-04-13 DIAGNOSIS — W19XXXA Unspecified fall, initial encounter: Secondary | ICD-10-CM

## 2013-04-13 DIAGNOSIS — I639 Cerebral infarction, unspecified: Secondary | ICD-10-CM

## 2013-04-13 DIAGNOSIS — R410 Disorientation, unspecified: Secondary | ICD-10-CM | POA: Diagnosis present

## 2013-04-13 LAB — GLUCOSE, CAPILLARY
Glucose-Capillary: 88 mg/dL (ref 70–99)
Glucose-Capillary: 136 mg/dL — ABNORMAL HIGH (ref 70–99)
Glucose-Capillary: 126 mg/dL — ABNORMAL HIGH (ref 70–99)
Glucose-Capillary: 166 mg/dL — ABNORMAL HIGH (ref 70–99)

## 2013-04-13 LAB — HEMOGLOBIN A1C
Hgb A1c MFr Bld: 5.2 % (ref <5.7)
Mean Plasma Glucose: 103 mg/dL (ref <117)

## 2013-04-13 LAB — FOLATE RBC: RBC Folate: 445 ng/mL (ref >280)

## 2013-04-13 LAB — LIPID PANEL
LDL Cholesterol: 41 mg/dL (ref 0–99)
Total CHOL/HDL Ratio: 2.2 RATIO
Triglycerides: 125 mg/dL (ref <150)

## 2013-04-13 LAB — CREATININE, URINE, RANDOM: Creatinine, Urine: 172.3 mg/dL

## 2013-04-13 LAB — TROPONIN I
Troponin I: <0.3 ng/mL (ref <0.30)
Troponin I: <0.3 ng/mL (ref <0.30)

## 2013-04-13 LAB — OSMOLALITY, URINE: Osmolality, Ur: 531 mOsm/kg (ref 390–1090)

## 2013-04-13 MED ORDER — ACETAMINOPHEN 325 MG PO TABS: 650.0000 mg | ORAL_TABLET | ORAL | Status: DC | PRN

## 2013-04-13 MED ORDER — SODIUM CHLORIDE 0.9 % IV SOLN
INTRAVENOUS | Status: AC
  Administered 2013-04-13: 03:00:00 via INTRAVENOUS

## 2013-04-13 MED ORDER — LEVOTHYROXINE SODIUM 25 MCG PO TABS
25.0000 ug | ORAL_TABLET | Freq: Every day | ORAL | Status: DC
  Administered 2013-04-13 – 2013-04-22 (×9): 25 ug via ORAL
  Filled 2013-04-13 (×12): qty 1

## 2013-04-13 MED ORDER — ADULT MULTIVITAMIN W/MINERALS CH
1.0000 | ORAL_TABLET | Freq: Every day | ORAL | Status: DC
  Administered 2013-04-13 – 2013-04-22 (×9): 1 via ORAL
  Filled 2013-04-13 (×10): qty 1

## 2013-04-13 MED ORDER — THIAMINE HCL 100 MG/ML IJ SOLN
100.0000 mg | Freq: Every day | INTRAMUSCULAR | Status: DC
  Filled 2013-04-13: qty 1

## 2013-04-13 MED ORDER — FOLIC ACID 1 MG PO TABS
1.0000 mg | ORAL_TABLET | Freq: Every day | ORAL | Status: DC
  Administered 2013-04-13 – 2013-04-22 (×9): 1 mg via ORAL
  Filled 2013-04-13 (×10): qty 1

## 2013-04-13 MED ORDER — ASPIRIN 81 MG PO TABS: 81.0000 mg | ORAL_TABLET | Freq: Every morning | ORAL | Status: DC

## 2013-04-13 MED ORDER — PIOGLITAZONE HCL 30 MG PO TABS
30.0000 mg | ORAL_TABLET | Freq: Every day | ORAL | Status: DC
  Filled 2013-04-13: qty 1

## 2013-04-13 MED ORDER — VITAMIN B-1 100 MG PO TABS
100.0000 mg | ORAL_TABLET | Freq: Every day | ORAL | Status: DC
  Administered 2013-04-13 – 2013-04-22 (×9): 100 mg via ORAL
  Filled 2013-04-13 (×11): qty 1

## 2013-04-13 MED ORDER — ASPIRIN 325 MG PO TABS
325.0000 mg | ORAL_TABLET | Freq: Every day | ORAL | Status: DC
  Administered 2013-04-13: 325 mg via ORAL
  Filled 2013-04-13: qty 1

## 2013-04-13 MED ORDER — LORAZEPAM 2 MG/ML IJ SOLN
0.0000 mg | Freq: Two times a day (BID) | INTRAMUSCULAR | Status: DC
  Administered 2013-04-15: 1 mg via INTRAVENOUS
  Filled 2013-04-13: qty 1

## 2013-04-13 MED ORDER — AMLODIPINE BESYLATE 10 MG PO TABS
10.0000 mg | ORAL_TABLET | Freq: Every morning | ORAL | Status: DC
  Administered 2013-04-13 – 2013-04-22 (×9): 10 mg via ORAL
  Filled 2013-04-13 (×10): qty 1

## 2013-04-13 MED ORDER — LORAZEPAM 1 MG PO TABS
1.0000 mg | ORAL_TABLET | Freq: Four times a day (QID) | ORAL | Status: DC | PRN
  Administered 2013-04-13 – 2013-04-15 (×2): 1 mg via ORAL
  Filled 2013-04-13 (×2): qty 1

## 2013-04-13 MED ORDER — ALLOPURINOL 100 MG PO TABS
100.0000 mg | ORAL_TABLET | Freq: Every day | ORAL | Status: DC
  Administered 2013-04-14 – 2013-04-21 (×8): 100 mg via ORAL
  Filled 2013-04-13 (×10): qty 1

## 2013-04-13 MED ORDER — CLOPIDOGREL BISULFATE 75 MG PO TABS
75.0000 mg | ORAL_TABLET | Freq: Every day | ORAL | Status: DC
  Administered 2013-04-14 – 2013-04-22 (×8): 75 mg via ORAL
  Filled 2013-04-13 (×10): qty 1

## 2013-04-13 MED ORDER — ATORVASTATIN CALCIUM 40 MG PO TABS
40.0000 mg | ORAL_TABLET | Freq: Every day | ORAL | Status: DC
  Administered 2013-04-13 – 2013-04-21 (×9): 40 mg via ORAL
  Filled 2013-04-13 (×11): qty 1

## 2013-04-13 MED ORDER — LORAZEPAM 2 MG/ML IJ SOLN
0.0000 mg | Freq: Four times a day (QID) | INTRAMUSCULAR | Status: AC
  Administered 2013-04-13: 1 mg via INTRAVENOUS
  Administered 2013-04-14: 2 mg via INTRAVENOUS
  Filled 2013-04-13 (×2): qty 1

## 2013-04-13 MED ORDER — POTASSIUM CHLORIDE CRYS ER 20 MEQ PO TBCR
20.0000 meq | EXTENDED_RELEASE_TABLET | Freq: Once | ORAL | Status: AC
  Administered 2013-04-13: 20 meq via ORAL
  Filled 2013-04-13: qty 1

## 2013-04-13 MED ORDER — LORAZEPAM 2 MG/ML IJ SOLN
1.0000 mg | Freq: Four times a day (QID) | INTRAMUSCULAR | Status: DC | PRN
  Administered 2013-04-13: 1 mg via INTRAVENOUS
  Filled 2013-04-13: qty 1

## 2013-04-13 MED ORDER — INSULIN ASPART 100 UNIT/ML ~~LOC~~ SOLN
0.0000 [IU] | SUBCUTANEOUS | Status: DC
  Administered 2013-04-13: 1 [IU] via SUBCUTANEOUS
  Administered 2013-04-13 – 2013-04-15 (×3): 2 [IU] via SUBCUTANEOUS

## 2013-04-13 NOTE — Progress Notes (Signed)
  Echocardiogram 2D Echocardiogram has been performed.  Kerby Hockley 04/13/2013, 11:24 AM

## 2013-04-13 NOTE — Consult Note (Signed)
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 diffuse moderate small vessel disease demonstrated on the MRA. 3. Remote lacunar infarcts involving the left lentiform nucleus and bilateral thalami. 4. Right maxillary sinusitis. These results will be called to the ordering clinician or representative by the Radiologist Assistant, and communication documented in the PACS Dashboard.   Electronically Signed   By: Gennette Pac M.D.   On: 04/13/2013 08:18   Mr Maxine Glenn Head/brain Wo Cm  04/13/2013   CLINICAL DATA:  Confusion.  Right arm tingling.  Recent falls.  EXAM: MRI HEAD WITHOUT CONTRAST  MRA HEAD WITHOUT CONTRAST  TECHNIQUE: Multiplanar, multiecho pulse sequences of the brain and surrounding structures were obtained without intravenous contrast. Angiographic images of the head were obtained using MRA technique without contrast.  COMPARISON:  CT head without contrast 04/12/2013.  FINDINGS: MRI HEAD FINDINGS  An acute punctate nonhemorrhagic infarct  is present in the right frontal operculum. No hemorrhage or mass lesion is present. Advanced atrophy and diffuse white matter disease is present bilaterally. Remote lacunar infarcts are present in the left putamen bilateral thalami. There is a remote left PICA infarct.  The globes and orbits are intact. A fluid level is present in the right maxillary sinus. The remaining paranasal sinuses are clear. Leftward nasal septal deviation is noted. The mastoid air cells are clear.  MRA HEAD FINDINGS  The internal carotid arteries demonstrate mild atherosclerotic irregularity in the distal cavernous and supra clinoid segments. There is no significant stenosis. The ICA termini are intact bilaterally. The A1 and M1 segments are normal. The left A1 segment is slightly dominant to the right. A small anterior communicating artery is present. The  MCA bifurcations are intact. There is marked attenuation of MCA branch vessels bilaterally, left greater than right.  Atherosclerotic irregularity is present throughout the intracranial vertebral arteries bilaterally. A 50% stenosis is present in the mid left vertebral artery. The PICA origins are not visualized. The basilar artery is intact. Both posterior cerebral arteries originate from the basilar tip. There is attenuation of distal PCA branch vessels bilaterally.  IMPRESSION: 1. Acute punctate nonhemorrhagic cortical infarct involving the right frontal operculum. 2. Advanced atrophy and extensive white matter disease, likely related to diffuse moderate small vessel disease demonstrated on the MRA. 3. Remote lacunar infarcts involving the left lentiform nucleus and bilateral thalami. 4. Right maxillary sinusitis. These results will be called to the ordering clinician or representative by the Radiologist Assistant, and communication documented in the PACS Dashboard.   Electronically Signed   By: Gennette Pac M.D.   On: 04/13/2013 08:18    Assessment and plan discussed with with  attending physician and they are in agreement.    Felicie Morn PA-C [facility] [phone]  04/13/2013, 2:43 PM   Assessment: 79 y.o. male with acute right frontal infarct.  Patient currently showing no deficit from infarct.  This infarct would not explain his right arm and hand tingling as it is on the wrong side. Likely incidental finding.  Stroke Risk Factors - diabetes mellitus, hyperlipidemia, hypertension and stroke  Recommend: 1) 2 D echo and carotid doppler 2) Change ASA to Plavix 3) PT/OT   Patient seen and examined together with physician assistant and I concur with the assessment and plan.  Wyatt Portela, MD

## 2013-04-13 NOTE — ED Notes (Signed)
Admitting MD is going to be seeing pt.  Advised floor that I will let admitting MD see pt prior to transporting.
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
## 2013-04-13 NOTE — Plan of Care (Signed)
Per pt and pt son, pt voided earlier this morning. Forgot to notify nurse. Will cont to monitor voiding so nurse can obtain sample.

## 2013-04-13 NOTE — ED Notes (Signed)
CONTACT INFORMATION:  Normand Sloop  [phone] Home  [phone] Work  [phone]  PT'S Dylan [name]: Cell  [phone]

## 2013-04-13 NOTE — Progress Notes (Signed)
Orthopedic Tech Progress Note Patient Details:  Dylan [name] 03-Feb-1931 [id number]  Patient ID: [name], male   DOB: 18-Jul-1930, 79 y.o.   MRN: [id number]   Shawnie Pons 04/13/2013, 9:13 AMCalled bio-[employer] for TLSO brace.

## 2013-04-13 NOTE — ED Notes (Signed)
Transporting patient to new room assignment. 
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
## 2013-04-14 ENCOUNTER — Inpatient Hospital Stay (HOSPITAL_COMMUNITY): Payer: PRIVATE HEALTH INSURANCE

## 2013-04-14 LAB — CBC
HCT: 33.3 % — ABNORMAL LOW (ref 39.0–52.0)
Hemoglobin: 11.3 g/dL — ABNORMAL LOW (ref 13.0–17.0)
MCV: 104.1 fL — ABNORMAL HIGH (ref 78.0–100.0)
Platelets: 159 10*3/uL (ref 150–400)
RBC: 3.2 MIL/uL — ABNORMAL LOW (ref 4.22–5.81)
WBC: 6.4 10*3/uL (ref 4.0–10.5)

## 2013-04-14 LAB — URINE CULTURE

## 2013-04-14 LAB — BASIC METABOLIC PANEL
CO2: 26 mEq/L (ref 19–32)
Calcium: 9.6 mg/dL (ref 8.4–10.5)
Chloride: 96 mEq/L (ref 96–112)
Glucose, Bld: 111 mg/dL — ABNORMAL HIGH (ref 70–99)
Potassium: 3.4 mEq/L — ABNORMAL LOW (ref 3.5–5.1)
Sodium: 136 mEq/L (ref 135–145)

## 2013-04-14 LAB — GLUCOSE, CAPILLARY
Glucose-Capillary: 105 mg/dL — ABNORMAL HIGH (ref 70–99)
Glucose-Capillary: 111 mg/dL — ABNORMAL HIGH (ref 70–99)
Glucose-Capillary: 117 mg/dL — ABNORMAL HIGH (ref 70–99)
Glucose-Capillary: 71 mg/dL (ref 70–99)

## 2013-04-14 LAB — TSH: TSH: 2.695 u[IU]/mL (ref 0.350–4.500)

## 2013-04-14 MED ORDER — CYANOCOBALAMIN 1000 MCG/ML IJ SOLN
1000.0000 ug | Freq: Once | INTRAMUSCULAR | Status: AC
  Administered 2013-04-14: 1000 ug via INTRAMUSCULAR
  Filled 2013-04-14 (×2): qty 1

## 2013-04-14 MED ORDER — POTASSIUM CHLORIDE CRYS ER 20 MEQ PO TBCR
20.0000 meq | EXTENDED_RELEASE_TABLET | Freq: Once | ORAL | Status: DC
  Filled 2013-04-14: qty 1

## 2013-04-14 NOTE — Progress Notes (Signed)
UR COMPLETED  

## 2013-04-14 NOTE — Progress Notes (Signed)
Stroke Team Progress Note  HISTORY Dylan [name] is an 79 y.o. male who was brought to hospital for falls and confusion and falls. Per son, both falls were secondary to getting his foot cought in the rug. . While in hospital MRI brain was obtained which demonstrated a acute punctate nonhemorrhagic cortical infarct involving the right frontal operculum. Neurology was consulted for this finding.   Date last known well: Unable to determine  Time last known well: Unable to determine  tPA Given: No: out of window   Patient was not a TPA candidate secondary to out of window. He was admitted for further evaluation and treatment.  SUBJECTIVE His family is at the bedside.  Overall he feels his condition is stable.  He is lying quiet in bed. After questionning, family feels that he has had some confusion over the past year and has gotten lost twice. Since the loss of his wife, he has been visiting her grave 2-3 times per day. They think he is depressed. He stays in bed most of the time.  OBJECTIVE Most recent Vital Signs: Filed Vitals:   04/13/13 1739 04/13/13 2138 04/14/13 0145 04/14/13 0612  BP: 125/54 141/61 132/59 133/63  Pulse: 79 91 90 98  Temp: 98.2 F (36.8 C) 98.2 F (36.8 C) 98.6 F (37 C) 97.4 F (36.3 C)  TempSrc: Oral Oral Axillary Axillary  Resp: 20 20 18 20   Height:      Weight:      SpO2: 97% 95% 100% 98%   CBG (last 3)   Recent Labs  04/14/13 0003 04/14/13 0235 04/14/13 0438  GLUCAP 71 98 120*    IV Fluid Intake:     MEDICATIONS  . allopurinol  100 mg Oral QHS  . amLODipine  10 mg Oral q morning - 10a  . atorvastatin  40 mg Oral q1800  . clopidogrel  75 mg Oral Q breakfast  . folic acid  1 mg Oral Daily  . insulin aspart  0-9 Units Subcutaneous Q4H  . levothyroxine  25 mcg Oral QAC breakfast  . LORazepam  0-4 mg Intravenous Q6H   Followed by  . [START ON 04/15/2013] LORazepam  0-4 mg Intravenous Q12H  . multivitamin with minerals  1 tablet Oral Daily  .  potassium chloride  20 mEq Oral Once  . thiamine  100 mg Oral Daily   PRN:  acetaminophen, LORazepam, LORazepam  Diet:  Carb Control thin liquids Activity:   Bathroom privileges with assistance DVT Prophylaxis:  SCD  CLINICALLY SIGNIFICANT STUDIES Basic Metabolic Panel:  Recent Labs Lab 04/12/13 2107 04/14/13 0520  NA 131* 136  K 3.2* 3.4*  CL 91* 96  CO2 25 26  GLUCOSE 159* 111*  BUN 43* 37*  CREATININE 2.92* 2.25*  CALCIUM 9.5 9.6   Liver Function Tests:  Recent Labs Lab 04/12/13 2107  AST 26  ALT 14  ALKPHOS 53  BILITOT 0.4  PROT 7.9  ALBUMIN 3.3*   CBC:  Recent Labs Lab 04/12/13 2107 04/14/13 0520  WBC 6.6 6.4  NEUTROABS 5.5  --   HGB 11.5* 11.3*  HCT 33.6* 33.3*  MCV 102.4* 104.1*  PLT 147* 159   Coagulation: No results found for this basename: LABPROT, INR,  in the last 168 hours Cardiac Enzymes:  Recent Labs Lab 04/13/13 0530 04/13/13 0906 04/13/13 1420  CKTOTAL 345*  --   --   TROPONINI <0.30 <0.30 <0.30   Urinalysis:  Recent Labs Lab 04/12/13 2111  COLORURINE YELLOW  LABSPEC 1.011  PHURINE 5.5  GLUCOSEU 250*  HGBUR MODERATE*  BILIRUBINUR NEGATIVE  KETONESUR NEGATIVE  PROTEINUR 30*  UROBILINOGEN 0.2  NITRITE NEGATIVE  LEUKOCYTESUR NEGATIVE   Lipid Panel    Component Value Date/Time   CHOL 123 04/13/2013 0530   TRIG 125 04/13/2013 0530   HDL 57 04/13/2013 0530   CHOLHDL 2.2 04/13/2013 0530   VLDL 25 04/13/2013 0530   LDLCALC 41 04/13/2013 0530   HgbA1C  Lab Results  Component Value Date   HGBA1C 5.2 04/13/2013    Urine Drug Screen:   No results found for this basename: labopia, cocainscrnur, labbenz, amphetmu, thcu, labbarb    Alcohol Level: No results found for this basename: ETH,  in the last 168 hours  Ct Abdomen Pelvis Wo Contrast 04/12/2013 No evidence of hemoperitoneum or other acute findings within the abdomen or pelvis.  Stable stent graft repair of abdominal aortic aneurysm.  Stable diverticulosis and left  inguinal hernia containing only fat.    Ct Head Wo Contrast 04/12/2013    1. No acute intracranial findings. 2. Stable advanced chronic small vessel ischemic changes and old left cerebellar infarct. 3. No evidence of acute cervical spine fracture, traumatic subluxation or static signs of instability. Relatively mild multilevel spondylosis.   Electronically Signed   By: Roxy Horseman M.D.   On: 04/12/2013 20:32   Ct Chest Wo Contrast 04/12/2013     No evidence of mediastinal hematoma.  Acute fractures of the T11 and T12 vertebral bodies, with retropulsion of bone into the spinal canal at level of T12 by approximately 4 mm.  Right lateral 5th rib fracture. No evidence of pneumothorax or hemothorax.    Ct Cervical Spine Wo Contrast 04/12/2013   1. No acute intracranial findings. 2. Stable advanced chronic small vessel ischemic changes and old left cerebellar infarct. 3. No evidence of acute cervical spine fracture, traumatic subluxation or static signs of instability. Relatively mild multilevel spondylosis.     Mri Brain Without Contrast 04/13/2013     An acute punctate nonhemorrhagic infarct is present in the right frontal operculum. No hemorrhage or mass lesion is present. Advanced atrophy and diffuse white matter disease is present bilaterally. Remote lacunar infarcts are present in the left putamen bilateral thalami. There is a remote left PICA infarct.  The globes and orbits are intact. A fluid level is present in the right maxillary sinus. The remaining paranasal sinuses are clear. Leftward nasal septal deviation is noted. The mastoid air cells are clear.   Mr Maxine Glenn Head/brain Wo Cm 04/13/2013  1. Acute punctate nonhemorrhagic cortical infarct involving the right frontal operculum. 2. Advanced atrophy and extensive white matter disease, likely related to diffuse moderate small vessel disease demonstrated on the MRA. 3. Remote lacunar infarcts involving the left lentiform nucleus and bilateral thalami.  4. Right maxillary sinusitis.     2D Echocardiogram   EF 60%, wall motion normal, LA normal size  Carotid Doppler   Findings suggest 1-39% internal carotid artery stenosis bilaterally. Vertebral arteries are patent with antegrade flow.  CXR    EKG  normal sinus rhythm.   Therapy Recommendations   Physical Exam   Alert, oriented, thought content appropriate. Speech fluent without evidence of aphasia. Able to follow 3 step commands without difficulty.  Has ecchymoses right forehead and periorbital Cranial Nerves:  II: Discs flat bilaterally; Visual fields grossly normal, pupils equal, round, reactive to light and accommodation  III,IV, VI: ptosis not present, extra-ocular motions intact bilaterally  V,VII: smile  symmetric, facial light touch sensation normal bilaterally  VIII: hearing normal bilaterally  IX,X: gag reflex present  XI: bilateral shoulder shrug  XII: midline tongue extension without atrophy or fasciculations  Motor:  Right : Upper extremity 5/5 Left: Upper extremity 5/5  Lower extremity 5/5 Lower extremity 5/5  Tone and bulk:normal tone throughout; no atrophy noted  Sensory: Pinprick and light touch intact throughout, bilaterally  Deep Tendon Reflexes:  Right: Upper Extremity Left: Upper extremity  biceps (C-5 to C-6) 2/4 biceps (C-5 to C-6) 2/4  tricep (C7) 2/4 triceps (C7) 2/4  Brachioradialis (C6) 2/4 Brachioradialis (C6) 2/4  Lower Extremity Lower Extremity  quadriceps (L-2 to L-4) 0/4 quadriceps (L-2 to L-4) 0/4  Achilles (S1) 0/4 Achilles (S1) 0/4  Plantars:  Mute bilaterally  Cerebellar:  normal finger-to-nose, normal heel-to-shin test  Gait:not tested  CV: pulses palpable throughout   ASSESSMENT Mr. DENARD TUMINELLO is a 79 y.o. male presenting with acute delirium, s/p multiple falls (not felt unusual per family). Imaging reveals questionable punctate nonhemorrhagic infarct in the right frontal operculum although by review of films this is not felt to be  a stroke. More likely the contusion was secondary to fall as a result of his foot getting caught in rug. No evidence of infection. Labs being drawn for mental status changes including TSH ordered. We have ordered EEG to assure that this is not seizure activity given his acute delirium. Of note, he has had more alcohol intake since the death of his wife. He is on withdrawal protocol which may be making him more sleepy.  On aspirin 81 mg orally every day prior to admission. Now on clopidogrel 75 mg orally every day for secondary stroke prevention. Patient with resultant acute delirium.    Hyperlipidemia, LDL 41, continue statin  Diabetes Mellitus, type 2, controlled, HGB A1C 5.2  History of Brain Aneurysm, ruptured, 1970's  Former smoker  Hospital day # 2  TREATMENT/PLAN  Continue clopidogrel 75 mg orally every day for secondary stroke prevention.  EEG ordered  Await further lab studies  Dr. Pearlean Brownie discussed plan of care with Dr. Catha Gosselin and family.  Gwendolyn Lima. Manson Passey, [facility], MBA, MHA [facility] Pager: [phone] 04/14/2013 12:46 PM  I have personally obtained a history, examined the patient, evaluated imaging results, and formulated the assessment and plan of care. I agree with the above. Delia Heady, MD

## 2013-04-14 NOTE — Procedures (Signed)
EEG report.  Brief clinical history: 79 y.o. male who was brought to hospital for falls and confusion and falls  Technique: this is a 17 channel routine scalp EEG performed at the bedside with bipolar and monopolar montages arranged in accordance to the international 10/20 system of electrode placement. One channel was dedicated to EKG recording.  Patient remained asleep during the entire study. No activating procedures performed.  Description: as the study begins, patient falls rapidly into sleep and remains asleep during throughout the entire recording. There is normal sleep architecture. No focal or generalized epileptiform discharges noted.  EKG showed sinus rhythm.  Impression: this is a normal asleep EEG. Please, be aware that a normal EEG does not exclude the possibility of epilepsy.  Clinical correlation is advised.  Wyatt Portela, MD

## 2013-04-14 NOTE — Progress Notes (Signed)
*  PRELIMINARY RESULTS* Vascular Ultrasound Carotid Duplex (Doppler) has been completed.  Findings suggest 1-39% internal carotid artery stenosis bilaterally. Vertebral arteries are patent with antegrade flow.  04/13/2013  Gertie Fey, RVT, RDCS, RDMS
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
## 2013-04-14 NOTE — Progress Notes (Signed)
OT Cancellation Note  Patient Details Name: WINIFRED BALOGH MRN: [id number] DOB: 09/11/1930   Cancelled Treatment:     Spoke with nurse and pt about to go down for test. Pt also very lethargic. Will re-attempt tomorrow.    Earlie Raveling OTR/L [phone] 04/14/2013, 3:39 PM

## 2013-04-14 NOTE — Progress Notes (Signed)
PT Cancellation Note  Patient Details Name: Dylan [name] MRN: [id number] DOB: 13-Apr-1931   Cancelled Treatment:    Reason Eval/Treat Not Completed: Patient's level of consciousness.  Will try again another time.     Sunny Schlein, [place] [phone] 04/14/2013, 10:54 AM

## 2013-04-14 NOTE — Progress Notes (Signed)
EEG Completed; Results Pending  

## 2013-04-14 NOTE — Clinical Documentation Improvement (Signed)
   Acute and chronic kidney disease documented.  White male with GRF range of 19 - 26 since admitted  Please clarify stage of CKD.  _______CKD Stage I - GFR > OR = 90 _______CKD Stage II - GFR 60-80 _______CKD Stage III - GFR 30-59 _______CKD Stage IV - GFR 15-29 _______CKD Stage V - GFR < 15 _______ESRD (End Stage Renal Disease) _______Other condition_____________ _______Cannot Clinically determine   Supporting Information:  Risk Factors:  Signs & Symptoms:  Diagnostics:  Treatment:  Thank You, Shellee Milo ,RN Clinical Documentation Specialist:  [phone]  [facility]- Health Information Management

## 2013-04-15 LAB — GLUCOSE, CAPILLARY
Glucose-Capillary: 102 mg/dL — ABNORMAL HIGH (ref 70–99)
Glucose-Capillary: 109 mg/dL — ABNORMAL HIGH (ref 70–99)
Glucose-Capillary: 154 mg/dL — ABNORMAL HIGH (ref 70–99)
Glucose-Capillary: 169 mg/dL — ABNORMAL HIGH (ref 70–99)

## 2013-04-15 MED ORDER — INSULIN ASPART 100 UNIT/ML ~~LOC~~ SOLN
0.0000 [IU] | Freq: Three times a day (TID) | SUBCUTANEOUS | Status: DC
  Administered 2013-04-16 (×2): 1 [IU] via SUBCUTANEOUS
  Administered 2013-04-16 – 2013-04-17 (×2): 2 [IU] via SUBCUTANEOUS
  Administered 2013-04-17 – 2013-04-18 (×2): 1 [IU] via SUBCUTANEOUS
  Administered 2013-04-18: 2 [IU] via SUBCUTANEOUS
  Administered 2013-04-18: 1 [IU] via SUBCUTANEOUS
  Administered 2013-04-19 (×2): 2 [IU] via SUBCUTANEOUS

## 2013-04-15 MED ORDER — STARCH (THICKENING) PO POWD
ORAL | Status: DC | PRN
  Filled 2013-04-15: qty 227
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
## 2013-04-15 NOTE — Progress Notes (Signed)
Stroke Team Progress Note  HISTORY Dylan [name] is an 79 y.o. male who was brought to hospital for falls and confusion and falls. Per son, both falls were secondary to getting his foot cought in the rug. . While in hospital MRI brain was obtained which demonstrated a acute punctate nonhemorrhagic cortical infarct involving the right frontal operculum. Neurology was consulted for this finding.   Date last known well: Unable to determine  Time last known well: Unable to determine  tPA Given: No: out of window   Patient was not a TPA candidate secondary to out of window. He was admitted for further evaluation and treatment.  SUBJECTIVE Patient is lying in room. No family around. No new neurological findings. More awake.  OBJECTIVE Most recent Vital Signs: Filed Vitals:   04/14/13 1711 04/14/13 2128 04/15/13 0139 04/15/13 0543  BP: 151/65 147/62 146/56 129/60  Pulse: 80 68 77 67  Temp: 97.7 F (36.5 C) 98.2 F (36.8 C) 98.2 F (36.8 C) 97.8 F (36.6 C)  TempSrc: Oral Axillary Oral Axillary  Resp: 8 16 18 18   Height:      Weight:      SpO2: 98% 96% 97% 94%   CBG (last 3)   Recent Labs  04/15/13 0009 04/15/13 0404 04/15/13 0736  GLUCAP 109* 102* 93    IV Fluid Intake:     MEDICATIONS  . allopurinol  100 mg Oral QHS  . amLODipine  10 mg Oral q morning - 10a  . atorvastatin  40 mg Oral q1800  . clopidogrel  75 mg Oral Q breakfast  . folic acid  1 mg Oral Daily  . insulin aspart  0-9 Units Subcutaneous Q4H  . levothyroxine  25 mcg Oral QAC breakfast  . LORazepam  0-4 mg Intravenous Q12H  . multivitamin with minerals  1 tablet Oral Daily  . potassium chloride  20 mEq Oral Once  . thiamine  100 mg Oral Daily   PRN:  acetaminophen, LORazepam, LORazepam  Diet:  Carb Control thin liquids Activity:   Bathroom privileges with assistance DVT Prophylaxis:  SCD  CLINICALLY SIGNIFICANT STUDIES Basic Metabolic Panel:   Recent Labs Lab 04/12/13 2107 04/14/13 0520  NA  131* 136  K 3.2* 3.4*  CL 91* 96  CO2 25 26  GLUCOSE 159* 111*  BUN 43* 37*  CREATININE 2.92* 2.25*  CALCIUM 9.5 9.6   Liver Function Tests:   Recent Labs Lab 04/12/13 2107  AST 26  ALT 14  ALKPHOS 53  BILITOT 0.4  PROT 7.9  ALBUMIN 3.3*   CBC:   Recent Labs Lab 04/12/13 2107 04/14/13 0520  WBC 6.6 6.4  NEUTROABS 5.5  --   HGB 11.5* 11.3*  HCT 33.6* 33.3*  MCV 102.4* 104.1*  PLT 147* 159   Coagulation: No results found for this basename: LABPROT, INR,  in the last 168 hours Cardiac Enzymes:   Recent Labs Lab 04/13/13 0530 04/13/13 0906 04/13/13 1420  CKTOTAL 345*  --   --   TROPONINI <0.30 <0.30 <0.30   Urinalysis:   Recent Labs Lab 04/12/13 2111  COLORURINE YELLOW  LABSPEC 1.011  PHURINE 5.5  GLUCOSEU 250*  HGBUR MODERATE*  BILIRUBINUR NEGATIVE  KETONESUR NEGATIVE  PROTEINUR 30*  UROBILINOGEN 0.2  NITRITE NEGATIVE  LEUKOCYTESUR NEGATIVE   Lipid Panel    Component Value Date/Time   CHOL 123 04/13/2013 0530   TRIG 125 04/13/2013 0530   HDL 57 04/13/2013 0530   CHOLHDL 2.2 04/13/2013 0530  VLDL 25 04/13/2013 0530   LDLCALC 41 04/13/2013 0530   HgbA1C  Lab Results  Component Value Date   HGBA1C 5.2 04/13/2013    Urine Drug Screen:   No results found for this basename: labopia,  cocainscrnur,  labbenz,  amphetmu,  thcu,  labbarb    Alcohol Level: No results found for this basename: ETH,  in the last 168 hours  Ct Abdomen Pelvis Wo Contrast 04/12/2013 No evidence of hemoperitoneum or other acute findings within the abdomen or pelvis.  Stable stent graft repair of abdominal aortic aneurysm.  Stable diverticulosis and left inguinal hernia containing only fat.    Ct Head Wo Contrast 04/12/2013    1. No acute intracranial findings. 2. Stable advanced chronic small vessel ischemic changes and old left cerebellar infarct. 3. No evidence of acute cervical spine fracture, traumatic subluxation or static signs of instability. Relatively mild  multilevel spondylosis.   Electronically Signed   By: Roxy Horseman M.D.   On: 04/12/2013 20:32   Ct Chest Wo Contrast 04/12/2013     No evidence of mediastinal hematoma.  Acute fractures of the T11 and T12 vertebral bodies, with retropulsion of bone into the spinal canal at level of T12 by approximately 4 mm.  Right lateral 5th rib fracture. No evidence of pneumothorax or hemothorax.    Ct Cervical Spine Wo Contrast 04/12/2013   1. No acute intracranial findings. 2. Stable advanced chronic small vessel ischemic changes and old left cerebellar infarct. 3. No evidence of acute cervical spine fracture, traumatic subluxation or static signs of instability. Relatively mild multilevel spondylosis.     Mri Brain Without Contrast 04/13/2013     An acute punctate nonhemorrhagic infarct is present in the right frontal operculum. No hemorrhage or mass lesion is present. Advanced atrophy and diffuse white matter disease is present bilaterally. Remote lacunar infarcts are present in the left putamen bilateral thalami. There is a remote left PICA infarct.  The globes and orbits are intact. A fluid level is present in the right maxillary sinus. The remaining paranasal sinuses are clear. Leftward nasal septal deviation is noted. The mastoid air cells are clear.   Mr Maxine Glenn Head/brain Wo Cm 04/13/2013  1. Acute punctate nonhemorrhagic cortical infarct involving the right frontal operculum. 2. Advanced atrophy and extensive white matter disease, likely related to diffuse moderate small vessel disease demonstrated on the MRA. 3. Remote lacunar infarcts involving the left lentiform nucleus and bilateral thalami. 4. Right maxillary sinusitis.     2D Echocardiogram   EF 60%, wall motion normal, LA normal size   Carotid Doppler   Findings suggest 1-39% internal carotid artery stenosis bilaterally. Vertebral arteries are patent with antegrade flow.  EEG this is a normal asleep EEG. Please, be aware that a normal EEG does  not exclude the possibility of epilepsy.   CXR    EKG  normal sinus rhythm.   Therapy Recommendations   Physical Exam   Alert, oriented, thought content appropriate. Speech fluent without evidence of aphasia. Able to follow 3 step commands without difficulty.  Has ecchymoses right forehead and periorbital Cranial Nerves:  II: Discs flat bilaterally; Visual fields grossly normal, pupils equal, round, reactive to light and accommodation  III,IV, VI: ptosis not present, extra-ocular motions intact bilaterally  V,VII: smile symmetric, facial light touch sensation normal bilaterally  VIII: hearing normal bilaterally  IX,X: gag reflex present  XI: bilateral shoulder shrug  XII: midline tongue extension without atrophy or fasciculations  Motor:  Right :  Upper extremity 5/5 Left: Upper extremity 5/5  Lower extremity 5/5 Lower extremity 5/5  Tone and bulk:normal tone throughout; no atrophy noted  Sensory: Pinprick and light touch intact throughout, bilaterally  Deep Tendon Reflexes:  Right: Upper Extremity Left: Upper extremity  biceps (C-5 to C-6) 2/4 biceps (C-5 to C-6) 2/4  tricep (C7) 2/4 triceps (C7) 2/4  Brachioradialis (C6) 2/4 Brachioradialis (C6) 2/4  Lower Extremity Lower Extremity  quadriceps (L-2 to L-4) 0/4 quadriceps (L-2 to L-4) 0/4  Achilles (S1) 0/4 Achilles (S1) 0/4  Plantars:  Mute bilaterally  Cerebellar:  normal finger-to-nose, normal heel-to-shin test  Gait:not tested  CV: pulses palpable throughout   ASSESSMENT Dylan [name] is a 79 y.o. male presenting with acute delirium, s/p multiple falls (not felt unusual per family). Imaging reveals questionable punctate nonhemorrhagic infarct in the right frontal operculum although by review of films this is not felt to be a stroke. More likely the contusion was secondary to fall as a result of his foot getting caught in rug. No evidence of infection. He is on withdrawal protocol which may be making him more sleepy.   On aspirin 81 mg orally every day prior to admission. Now on clopidogrel 75 mg orally every day for secondary stroke prevention. Patient with resultant acute delirium.    Hyperlipidemia, LDL 41, continue statin  Diabetes Mellitus, type 2, controlled, HGB A1C 5.2  History of Brain Aneurysm, ruptured, 1970's  Former smoker  Vit b12 deficiency, replaced  Depression.  Alcohol use, cessation counseling.  Hospital day # 3  TREATMENT/PLAN  Change back to baby aspirin as prior to admission for secondary stroke prevention as this presentation is not felt to be stroke related.  Dr. Pearlean Brownie discussed plan of care with Dr. Mahala Menghini.  Have patient follow up with Primary MD 2 weeks for hospital follow up (or with Dr. Pearlean Brownie in 2 months if needed).  Recommend depression treatment as patient's spouse recently died.  Stroke service will sign off.  Gwendolyn Lima. Manson Passey, [facility], MBA, MHA [facility] Pager: [phone] 04/15/2013 8:44 AM  I have personally obtained a history, examined the patient, evaluated imaging results, and formulated the assessment and plan of care. I agree with the above. Delia Heady, MD

## 2013-04-15 NOTE — Evaluation (Signed)
Physical Therapy Evaluation Patient Details Name: Dylan [name] MRN: [id number] DOB: 1930/12/04 Today's Date: 04/15/2013 Time: 4098-1191 PT Time Calculation (min): 36 min  PT Assessment / Plan / Recommendation History of Present Illness  pt rpesents with multiple recent falls resulting in R 5th rib fx, T11-12 fxs, multiple contusions, and MRI showing Acute Nonhemmorhagic R Frontal Infarct and remote L Lentiform Nucleus and Bil Thalami Infarcts.    Clinical Impression  Pt generally unsteady and with cognitive deficits.  Pt's son indicates multiple falls PTA and that all contusions were a result of falls.  At this point pt needs SLP acutely for cognition and swallowing as pt tends to pocket food.  Pt would benefit from CIR at D/C.      PT Assessment  Patient needs continued PT services    Follow Up Recommendations  CIR    Does the patient have the potential to tolerate intense rehabilitation      Barriers to Discharge        Equipment Recommendations  Rolling walker with 5" wheels    Recommendations for Other Services Speech consult;Rehab consult   Frequency Min 3X/week    Precautions / Restrictions Precautions Precautions: Fall Precaution Comments: No orders for brace, however in MD note NS indicated use of brace for mobility 2/2 T11-12 fxs.   Required Braces or Orthoses: Spinal Brace Spinal Brace: Thoracolumbosacral orthotic Restrictions Weight Bearing Restrictions: No   Pertinent Vitals/Pain Indicates Bil shoulders are "sore".        Mobility  Bed Mobility Bed Mobility: Rolling Left;Left Sidelying to Sit;Sitting - Scoot to Edge of Bed Rolling Left: 4: Min assist Left Sidelying to Sit: 4: Min assist Sitting - Scoot to Edge of Bed: 4: Min assist Details for Bed Mobility Assistance: cues for encouragement and initiation.  cueing and facilitation for back precautions and log roll.   Transfers Transfers: Sit to Stand;Stand to Sit Sit to Stand: 1: +2 Total assist;With  upper extremity assist;From bed;From toilet Sit to Stand: Patient Percentage: 40% Stand to Sit: 1: +2 Total assist;With upper extremity assist;To toilet;To chair/3-in-1;To bed Stand to Sit: Patient Percentage: 40% Details for Transfer Assistance: pt with posterior lean and generally unsteady throughout transfers.   Ambulation/Gait Ambulation/Gait Assistance: 1: +2 Total assist Ambulation/Gait: Patient Percentage: 50% Ambulation Distance (Feet): 15 Feet (10) Assistive device: 2 person hand held assist Ambulation/Gait Assistance Details: pt generally unsteady with LOB Bil and posteriorly.  pt needs cueing to attend to task and safety.   Gait Pattern: Step-through pattern;Decreased stride length;Wide base of support Stairs: No Wheelchair Mobility Wheelchair Mobility: No    Exercises     PT Diagnosis: Difficulty walking  PT Problem List: Decreased strength;Decreased activity tolerance;Decreased balance;Decreased mobility;Decreased coordination;Decreased cognition;Decreased knowledge of use of DME;Decreased safety awareness;Decreased knowledge of precautions PT Treatment Interventions: DME instruction;Gait training;Stair training;Functional mobility training;Therapeutic activities;Therapeutic exercise;Balance training;Neuromuscular re-education;Cognitive remediation;Patient/family education     PT Goals(Current goals can be found in the care plan section) Acute Rehab PT Goals Patient Stated Goal: None stated.   PT Goal Formulation: With patient Time For Goal Achievement: 04/29/13 Potential to Achieve Goals: Good  Visit Information  Last PT Received On: 04/15/13 Assistance Needed: +2 History of Present Illness: pt rpesents with multiple recent falls resulting in R 5th rib fx, T11-12 fxs, multiple contusions, and MRI showing Acute Nonhemmorhagic R Frontal Infarct and remote L Lentiform Nucleus and Bil Thalami Infarcts.         Prior Functioning  Home Living Family/patient expects to  be discharged  to:: Inpatient rehab Living Arrangements: Children Additional Comments: pt's daughter lives with him and can provide S but not heavy lifting A.   Prior Function Level of Independence: Independent Comments: pt has been slowly declining and drinking etoh since his wife passed 6 months ago.   Communication Communication: No difficulties Dominant Hand: Right    Cognition  Cognition Arousal/Alertness: Lethargic Behavior During Therapy: Flat affect Overall Cognitive Status: Impaired/Different from baseline Area of Impairment: Orientation;Attention;Memory;Following commands;Safety/judgement;Awareness;Problem solving Orientation Level: Disoriented to;Time;Situation Current Attention Level: Sustained Memory: Decreased short-term memory Following Commands: Follows one step commands with increased time Safety/Judgement: Decreased awareness of safety;Decreased awareness of deficits Awareness: Emergent;Anticipatory Problem Solving: Slow processing;Decreased initiation;Difficulty sequencing;Requires verbal cues;Requires tactile cues    Extremity/Trunk Assessment Upper Extremity Assessment Upper Extremity Assessment: Defer to OT evaluation Lower Extremity Assessment Lower Extremity Assessment: Generalized weakness   Balance Balance Balance Assessed: Yes Static Standing Balance Static Standing - Balance Support: No upper extremity supported;During functional activity Static Standing - Level of Assistance: 4: Min assist Static Standing - Comment/# of Minutes: Sttod at sink for hand hygiene.  Generally unsteady and occasionally needed to place UE on sink to A with balance.    End of Session PT - End of Session Equipment Utilized During Treatment: Gait belt;Back brace Activity Tolerance: Patient tolerated treatment well Patient left: in chair;with call bell/phone within reach;with nursing/sitter in room;with family/visitor present Nurse Communication: Mobility status  GP      Sunny Schlein, [place] [phone] 04/15/2013, 12:41 PM

## 2013-04-15 NOTE — ED Provider Notes (Signed)
Medical screening examination/treatment/procedure(s) were performed by non-physician practitioner and as supervising physician I was immediately available for consultation/collaboration.  EKG Interpretation    Date/Time:  Sunday April 12 2013 19:05:50 EST Ventricular Rate:  71 PR Interval:  193 QRS Duration: 94 QT Interval:  400 QTC Calculation: 435 R Axis:   62 Text Interpretation:  Sinus rhythm Baseline wander in lead(s) V2 ED PHYSICIAN INTERPRETATION AVAILABLE IN [facility] Confirmed by TEST, RECORD ([id number]) on 04/14/2013 9:45:40 AM              Roney Marion, MD 04/15/13 [id number]
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
## 2013-04-16 LAB — COMPREHENSIVE METABOLIC PANEL
AST: 19 U/L (ref 0–37)
Albumin: 2.9 g/dL — ABNORMAL LOW (ref 3.5–5.2)
Alkaline Phosphatase: 43 U/L (ref 39–117)
CO2: 29 mEq/L (ref 19–32)
Calcium: 9.6 mg/dL (ref 8.4–10.5)
Chloride: 98 mEq/L (ref 96–112)
Creatinine, Ser: 1.83 mg/dL — ABNORMAL HIGH (ref 0.50–1.35)
GFR calc non Af Amer: 33 mL/min — ABNORMAL LOW (ref >90)
Potassium: 3.5 mEq/L (ref 3.5–5.1)
Total Bilirubin: 0.5 mg/dL (ref 0.3–1.2)

## 2013-04-16 LAB — GLUCOSE, CAPILLARY
Glucose-Capillary: 145 mg/dL — ABNORMAL HIGH (ref 70–99)
Glucose-Capillary: 146 mg/dL — ABNORMAL HIGH (ref 70–99)
Glucose-Capillary: 166 mg/dL — ABNORMAL HIGH (ref 70–99)
Glucose-Capillary: 101 mg/dL — ABNORMAL HIGH (ref 70–99)

## 2013-04-16 MED ORDER — HALOPERIDOL LACTATE 5 MG/ML IJ SOLN: 1.0000 mg | Freq: Once | INTRAMUSCULAR | Status: DC

## 2013-04-16 MED ORDER — HALOPERIDOL LACTATE 5 MG/ML IJ SOLN
2.0000 mg | Freq: Once | INTRAMUSCULAR | Status: AC
  Administered 2013-04-16: 2 mg via INTRAMUSCULAR
  Filled 2013-04-16: qty 1

## 2013-04-16 MED ORDER — LORAZEPAM 1 MG PO TABS: 1.0000 mg | ORAL_TABLET | Freq: Four times a day (QID) | ORAL | Status: AC | PRN

## 2013-04-16 MED ORDER — LORAZEPAM 2 MG/ML IJ SOLN
0.0000 mg | Freq: Four times a day (QID) | INTRAMUSCULAR | Status: DC
  Administered 2013-04-17 (×2): 2 mg via INTRAMUSCULAR
  Filled 2013-04-16 (×2): qty 1

## 2013-04-16 MED ORDER — LORAZEPAM 2 MG/ML IJ SOLN: 1.0000 mg | Freq: Four times a day (QID) | INTRAMUSCULAR | Status: AC | PRN
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
## 2013-04-17 ENCOUNTER — Inpatient Hospital Stay (HOSPITAL_COMMUNITY): Payer: PRIVATE HEALTH INSURANCE

## 2013-04-17 DIAGNOSIS — F1020 Alcohol dependence, uncomplicated: Secondary | ICD-10-CM

## 2013-04-17 LAB — GLUCOSE, CAPILLARY
Glucose-Capillary: 191 mg/dL — ABNORMAL HIGH (ref 70–99)
Glucose-Capillary: 112 mg/dL — ABNORMAL HIGH (ref 70–99)
Glucose-Capillary: 154 mg/dL — ABNORMAL HIGH (ref 70–99)

## 2013-04-17 NOTE — Progress Notes (Signed)
Patient continues to be nxious . Ativan 2 mg not effective. Restless. Keep climbing out of bed. Staff taking turns to monitor patient. Bed alarm on. [place] scale 14. Will continue to monitor. Dylan [name]

## 2013-04-17 NOTE — Consult Note (Signed)
[facility] Face-to-Face Psychiatry Consult   Reason for Consult:  Confusion; Excessive alcohol use for 6 months.  Referring Physician: Raechel Chute, M.D.   Dylan [name] is an 79 y.o. male.  Assessment: AXIS I:  Alchol Use disorder, Severe AXIS II:  No diagnosis AXIS III:   Past Medical History  Diagnosis Date  . Hyperlipidemia   . Diabetes mellitus     Type II  . Cholelithiasis     which has resolved, apparently  . Anemia     diffuse  . COPD (chronic obstructive pulmonary disease)   . Pneumonia   . AAA (abdominal aortic aneurysm) 02/11/07  . Stroke 1972    CVA  . Glaucoma   . Gout   . Brain aneurysm     hx of ruptured aneurysm in 70's  . Hypertension     dr Cornelia Copa     . Chronic kidney disease     chronic renal insufficiency   AXIS IV:  other psychosocial or environmental problems AXIS V:  21-30 behavior considerably influenced by delusions or hallucinations OR serious impairment in judgment, communication OR inability to function in almost all areas  Plan:  Continue detox. He may not be medically appropriate for detox in a geropsych unit. Will follow patient.  May require placement in higher care facility, daughter instructed to speak to social worker about this. Continue detox protocol  Subjective:   Dylan [name] is a 79 y.o. male patient admitted with confusion.  HPI:  HPI Comments: Psychiatry was consulted to assess this patient with Altered mental status secondary to alcohol intake.  His is a poor historian. Collateral information by his daughter Okey Regal.   . Location: Patient has been drinking excessively prior to his wife's death and worsened in the past  . Quality: Unable to assess at this time. . Severity: severe . Duration: Unknown  . Timing:Unable to assess. . Contex:Death of his wife  from cancer. . Modifying factors: Unable to assess. . Associated signs and symptoms: Unable to assess.  Collateral information from his daughter reveals that the patient  stays at home with his daughter who is on disability.  Patient's daughter Okey Regal is his power of attorney and check on him daily.  The patient reports that the patient has been drinking and she found 9-10 bottles and would driving to the store for beer as well. Okey Regal reports the patient appears to have worsened cognitively since yesterday.   Past Psychiatric History: Past Medical History  Diagnosis Date  . Hyperlipidemia   . Diabetes mellitus     Type II  . Cholelithiasis     which has resolved, apparently  . Anemia     diffuse  . COPD (chronic obstructive pulmonary disease)   . Pneumonia   . AAA (abdominal aortic aneurysm) 02/11/07  . Stroke 1972    CVA  . Glaucoma   . Gout   . Brain aneurysm     hx of ruptured aneurysm in 70's  . Hypertension     dr Cornelia Copa     . Chronic kidney disease     chronic renal insufficiency    reports that he quit smoking about 44 years ago. His smoking use included Cigarettes. He has a 25 pack-year smoking history. He quit smokeless tobacco use about 41 years ago. He reports that he drinks about 0.5 ounces of alcohol per week. He reports that he does not use illicit drugs. Family History  Problem Relation Age of Onset  . Coronary  artery disease Brother   . Anesthesia problems Neg Hx   . Hypotension Neg Hx   . Malignant hyperthermia Neg Hx   . Pseudochol deficiency Neg Hx   . Cancer Mother     BREAST     Living Arrangements: Children   Abuse/Neglect [facility]) Physical Abuse: Denies Verbal Abuse: Denies Sexual Abuse: Denies Allergies:   Allergies  Allergen Reactions  . Percocet [Oxycodone-Acetaminophen] Itching, Rash and Other (See Comments)    Very disoriented, hallucinations  . Hydrocodone-Acetaminophen Itching and Rash    ACT Assessment Complete:  Yes:    Educational Status    Risk to Self: Risk to self Is patient at risk for suicide?: No Substance abuse history and/or treatment for substance abuse?: No  Risk to Others:    Abuse:  Abuse/Neglect Assessment (Assessment to be complete while patient is alone) Physical Abuse: Denies Verbal Abuse: Denies Sexual Abuse: Denies Exploitation of patient/patient's resources: Denies Self-Neglect: Denies  Prior Inpatient Therapy:  Unknown  Prior Outpatient Therapy:   Unknown  Additional Information:   Unknown   Objective: Blood pressure 103/52, pulse 84, temperature 97.3 F (36.3 C), temperature source Oral, resp. rate 20, height 5\' 9"  (1.753 m), weight 84 kg (185 lb 3 oz), SpO2 94.00%.Body mass index is 27.33 kg/(m^2). Results for orders placed during the hospital encounter of 04/12/13 (from the past 72 hour(s))  GLUCOSE, CAPILLARY     Status: Abnormal   Collection Time    04/14/13 10:19 PM      Result Value Range   Glucose-Capillary 105 (*) 70 - 99 mg/dL  GLUCOSE, CAPILLARY     Status: Abnormal   Collection Time    04/15/13 12:09 AM      Result Value Range   Glucose-Capillary 109 (*) 70 - 99 mg/dL  GLUCOSE, CAPILLARY     Status: Abnormal   Collection Time    04/15/13  4:04 AM      Result Value Range   Glucose-Capillary 102 (*) 70 - 99 mg/dL  GLUCOSE, CAPILLARY     Status: None   Collection Time    04/15/13  7:36 AM      Result Value Range   Glucose-Capillary 93  70 - 99 mg/dL  GLUCOSE, CAPILLARY     Status: Abnormal   Collection Time    04/15/13 12:14 PM      Result Value Range   Glucose-Capillary 169 (*) 70 - 99 mg/dL  GLUCOSE, CAPILLARY     Status: Abnormal   Collection Time    04/15/13  4:26 PM      Result Value Range   Glucose-Capillary 154 (*) 70 - 99 mg/dL  GLUCOSE, CAPILLARY     Status: Abnormal   Collection Time    04/15/13 10:33 PM      Result Value Range   Glucose-Capillary 145 (*) 70 - 99 mg/dL  COMPREHENSIVE METABOLIC PANEL     Status: Abnormal   Collection Time    04/16/13  6:07 AM      Result Value Range   Sodium 135  135 - 145 mEq/L   Potassium 3.5  3.5 - 5.1 mEq/L   Chloride 98  96 - 112 mEq/L   CO2 29  19 - 32 mEq/L   Glucose, Bld  152 (*) 70 - 99 mg/dL   BUN 35 (*) 6 - 23 mg/dL   Creatinine, Ser 9.81 (*) 0.50 - 1.35 mg/dL   Calcium 9.6  8.4 - 19.1 mg/dL   Total Protein 6.8  6.0 -  8.3 g/dL   Albumin 2.9 (*) 3.5 - 5.2 g/dL   AST 19  0 - 37 U/L   ALT 12  0 - 53 U/L   Alkaline Phosphatase 43  39 - 117 U/L   Total Bilirubin 0.5  0.3 - 1.2 mg/dL   GFR calc non Af Amer 33 (*) >90 mL/min   GFR calc Af Amer 38 (*) >90 mL/min   Comment: (NOTE)     The eGFR has been calculated using the CKD EPI equation.     This calculation has not been validated in all clinical situations.     eGFR's persistently <90 mL/min signify possible Chronic Kidney     Disease.  GLUCOSE, CAPILLARY     Status: Abnormal   Collection Time    04/16/13  6:42 AM      Result Value Range   Glucose-Capillary 146 (*) 70 - 99 mg/dL  GLUCOSE, CAPILLARY     Status: Abnormal   Collection Time    04/16/13 11:38 AM      Result Value Range   Glucose-Capillary 132 (*) 70 - 99 mg/dL  GLUCOSE, CAPILLARY     Status: Abnormal   Collection Time    04/16/13  4:42 PM      Result Value Range   Glucose-Capillary 166 (*) 70 - 99 mg/dL  GLUCOSE, CAPILLARY     Status: Abnormal   Collection Time    04/16/13  9:53 PM      Result Value Range   Glucose-Capillary 101 (*) 70 - 99 mg/dL  GLUCOSE, CAPILLARY     Status: Abnormal   Collection Time    04/17/13  6:34 AM      Result Value Range   Glucose-Capillary 145 (*) 70 - 99 mg/dL   Comment 1 Documented in Chart     Comment 2 Notify RN    GLUCOSE, CAPILLARY     Status: Abnormal   Collection Time    04/17/13 11:35 AM      Result Value Range   Glucose-Capillary 191 (*) 70 - 99 mg/dL  GLUCOSE, CAPILLARY     Status: Abnormal   Collection Time    04/17/13  4:45 PM      Result Value Range   Glucose-Capillary 112 (*) 70 - 99 mg/dL   Labs are reviewed.  Current Facility-Administered Medications  Medication Dose Route Frequency Provider Last Rate Last Dose  . acetaminophen (TYLENOL) tablet 650 mg  650 mg Oral Q4H  PRN Therisa Doyne, MD      . allopurinol (ZYLOPRIM) tablet 100 mg  100 mg Oral QHS Therisa Doyne, MD   100 mg at 04/16/13 2324  . amLODipine (NORVASC) tablet 10 mg  10 mg Oral q morning - 10a Therisa Doyne, MD   10 mg at 04/17/13 1040  . atorvastatin (LIPITOR) tablet 40 mg  40 mg Oral q1800 Therisa Doyne, MD   40 mg at 04/17/13 1728  . clopidogrel (PLAVIX) tablet 75 mg  75 mg Oral Q breakfast Ulice Dash, PA-C   75 mg at 04/17/13 7829  . folic acid (FOLVITE) tablet 1 mg  1 mg Oral Daily Therisa Doyne, MD   1 mg at 04/17/13 1040  . food thickener (THICK IT) powder   Oral PRN Rhetta Mura, MD      . insulin aspart (novoLOG) injection 0-9 Units  0-9 Units Subcutaneous TID WC Leda Gauze, NP   2 Units at 04/17/13 1216  . levothyroxine (SYNTHROID, LEVOTHROID) tablet 25 mcg  25 mcg Oral QAC breakfast Therisa Doyne, MD   25 mcg at 04/17/13 0814  . LORazepam (ATIVAN) injection 0-4 mg  0-4 mg Intramuscular Q6H Leda Gauze, NP   2 mg at 04/17/13 0259  . multivitamin with minerals tablet 1 tablet  1 tablet Oral Daily Therisa Doyne, MD   1 tablet at 04/17/13 1040  . potassium chloride SA (K-DUR,KLOR-CON) CR tablet 20 mEq  20 mEq Oral Once [employer], DO      . thiamine (VITAMIN B-1) tablet 100 mg  100 mg Oral Daily Therisa Doyne, MD   100 mg at 04/17/13 1040    Psychiatric Specialty Exam:     Blood pressure 103/52, pulse 84, temperature 97.3 F (36.3 C), temperature source Oral, resp. rate 20, height 5\' 9"  (1.753 m), weight 84 kg (185 lb 3 oz), SpO2 94.00%.Body mass index is 27.33 kg/(m^2).  General Appearance: Disheveled and Lying in bed. Sitter reports he attempts to uncover himself  Eye Contact::  Poor  Speech:  Garbled and Slow  Volume:  Decreased  Mood:  "all right."  Affect:  Blunt  Thought Process:  Disorganized  Orientation:  Other:  Oriented to person only.  Thought Content:  No paranoia or delusions  Suicidal Thoughts:   No  Homicidal Thoughts:  No  Memory:  Immediate;   Poor Recent;   Poor Remote;   Poor  Judgement:  Impaired  Insight:  Lacking  Psychomotor Activity:  Decreased  Concentration:  Poor  Recall:  Poor  Akathisia:  No  AIMS (if indicated):   Not indicated  Assets:  Others:  NOne  Sleep:      Treatment Plan Summary: Daily contact with patient to assess and evaluate symptoms and progress in treatment Medication management Will follow this patient. Recommend his driver's license is suspended. Daughter has patient's car keys and will remove car from.  Perrion Diesel, [facility] 04/17/2013 8:17 PM
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
## 2013-04-17 NOTE — Progress Notes (Signed)
Physical Therapy Treatment Patient Details Name: Dylan [name] MRN: [id number] DOB: June 05, 1930 Today's Date: 04/17/2013 Time: 2952-8413 PT Time Calculation (min): 21 min  PT Assessment / Plan / Recommendation  History of Present Illness pt presents with multiple recent falls resulting in Rt 5th rib fx, T11-12 fxs, multiple contusions, and MRI showing Acute Nonhemmorhagic R Frontal Infarct and remote L Lentiform Nucleus, TBI CHI and Bil Thalami Infarcts.     PT Comments   Pt more easily aroused today and follows one step directions if given time.  Pt sat on toilet to urinate and then while standing at sink began to urinate again.  Sitter present and A with hygiene.  Feel pt would benefit from CIR at D/C.    Follow Up Recommendations  CIR     Does the patient have the potential to tolerate intense rehabilitation     Barriers to Discharge        Equipment Recommendations  Rolling walker with 5" wheels    Recommendations for Other Services Rehab consult  Frequency Min 3X/week   Progress towards PT Goals Progress towards PT goals: Progressing toward goals  Plan Current plan remains appropriate    Precautions / Restrictions Precautions Precautions: Fall Precaution Comments: No orders for brace, however in MD note NS indicated use of brace for mobility 2/2 T11-12 fxs.   Required Braces or Orthoses: Spinal Brace Spinal Brace: Thoracolumbosacral orthotic Restrictions Weight Bearing Restrictions: No   Pertinent Vitals/Pain Did not indicate pain.      Mobility  Bed Mobility Bed Mobility: Rolling Left;Left Sidelying to Sit;Sitting - Scoot to [place] of Bed;Sit to Sidelying Right Rolling Left: 4: Min assist Left Sidelying to Sit: 4: Min assist Sitting - Scoot to Edge of Bed: 4: Min assist Sit to Sidelying Right: 3: Mod assist Details for Bed Mobility Assistance: A with Bil LEs to return to bed.  cueing for log roll and safety.   Transfers Transfers: Sit to Stand;Stand to Sit Sit to  Stand: 1: +2 Total assist;With upper extremity assist;From bed;From toilet Sit to Stand: Patient Percentage: 50% Stand to Sit: 1: +2 Total assist;With upper extremity assist;To toilet;To bed Stand to Sit: Patient Percentage: 50% Details for Transfer Assistance: pt continues to have posterior lean when coming to stand and very wide BOS.  cues for use of UEs and attending to task.   Ambulation/Gait Ambulation/Gait Assistance: 1: +2 Total assist Ambulation/Gait: Patient Percentage: 60% Ambulation Distance (Feet): 15 Feet (and 10) Assistive device: 2 person hand held assist Ambulation/Gait Assistance Details: pt generally unsteady with wide BOS.  pt with occasional LOB especially when distracted.   Gait Pattern: Step-through pattern;Decreased stride length;Wide base of support Stairs: No Wheelchair Mobility Wheelchair Mobility: No    Exercises     PT Diagnosis:    PT Problem List:   PT Treatment Interventions:     PT Goals (current goals can now be found in the care plan section) Acute Rehab PT Goals Patient Stated Goal: None stated.   Time For Goal Achievement: 04/29/13 Potential to Achieve Goals: Good  Visit Information  Last PT Received On: 04/17/13 Assistance Needed: +2 History of Present Illness: pt presents with multiple recent falls resulting in Rt 5th rib fx, T11-12 fxs, multiple contusions, and MRI showing Acute Nonhemmorhagic R Frontal Infarct and remote L Lentiform Nucleus, TBI CHI and Bil Thalami Infarcts.      Subjective Data  Patient Stated Goal: None stated.     Cognition  Cognition Arousal/Alertness: Lethargic Behavior During Therapy:  Flat affect Overall Cognitive Status: Impaired/Different from baseline Area of Impairment: Orientation;Attention;Memory;Following commands;Safety/judgement;Awareness;Problem solving;[place] level Orientation Level: Disoriented to;Time;Situation Current Attention Level: Sustained Memory: Decreased short-term memory Following  Commands: Follows one step commands with increased time Safety/Judgement: Decreased awareness of safety;Decreased awareness of deficits Awareness: Emergent;Anticipatory Problem Solving: Slow processing;Decreased initiation;Difficulty sequencing;Requires verbal cues;Requires tactile cues    Balance  Balance Balance Assessed: Yes Static Standing Balance Static Standing - Balance Support: No upper extremity supported;During functional activity Static Standing - Level of Assistance: 4: Min assist Static Standing - Comment/# of Minutes: pt able to stand at sink for hand hygiene after using bathroom.  Occasionally leans against sink to maintain balance.    End of Session PT - End of Session Equipment Utilized During Treatment: Gait belt;Back brace Activity Tolerance: Patient tolerated treatment well Patient left: in bed;with call bell/phone within reach;with nursing/sitter in room Nurse Communication: Mobility status   GP     Sunny Schlein, [place] [phone] 04/17/2013, 1:05 PM

## 2013-04-17 NOTE — Progress Notes (Signed)
Patient continues to be restless. Pulling of condom cath twice, incontinent of B/B. Climbing out of bed. Condom cath left out at this time. Penis is red.
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
## 2013-04-18 LAB — BASIC METABOLIC PANEL
BUN: 33 mg/dL — ABNORMAL HIGH (ref 6–23)
Calcium: 9.6 mg/dL (ref 8.4–10.5)
GFR calc Af Amer: 37 mL/min — ABNORMAL LOW (ref >90)
GFR calc non Af Amer: 32 mL/min — ABNORMAL LOW (ref >90)
Glucose, Bld: 148 mg/dL — ABNORMAL HIGH (ref 70–99)
Potassium: 4.7 mEq/L (ref 3.5–5.1)
Sodium: 139 mEq/L (ref 135–145)

## 2013-04-18 LAB — GLUCOSE, CAPILLARY
Glucose-Capillary: 150 mg/dL — ABNORMAL HIGH (ref 70–99)
Glucose-Capillary: 153 mg/dL — ABNORMAL HIGH (ref 70–99)
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
## 2013-04-19 LAB — GLUCOSE, CAPILLARY
Glucose-Capillary: 223 mg/dL — ABNORMAL HIGH (ref 70–99)
Glucose-Capillary: 167 mg/dL — ABNORMAL HIGH (ref 70–99)
Glucose-Capillary: 157 mg/dL — ABNORMAL HIGH (ref 70–99)
Glucose-Capillary: 218 mg/dL — ABNORMAL HIGH (ref 70–99)
Glucose-Capillary: 150 mg/dL — ABNORMAL HIGH (ref 70–99)
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
## 2013-04-19 NOTE — Progress Notes (Signed)
Clinical Social Work Department CLINICAL SOCIAL WORK PLACEMENT NOTE 04/19/2013  Patient:  Dylan [name], Dylan [name]  Account Number:  [id number] Admit date:  04/12/2013  Clinical Social Worker:  Jetta Lout, Theresia Majors  Date/time:  04/19/2013 11:15 AM  Clinical Social Work is seeking post-discharge placement for this patient at the following level of care:   SKILLED NURSING   (*CSW will update this form in Epic as items are completed)   04/19/2013  Patient/family provided with [facility] Department of Clinical Social Work's list of facilities offering this level of care within the geographic area requested by the patient (or if unable, by the patient's family).  04/19/2013  Patient/family informed of their freedom to choose among providers that offer the needed level of care, that participate in Medicare, Medicaid or managed care program needed by the patient, have an available bed and are willing to accept the patient.  04/19/2013  Patient/family informed of [facility]' ownership interest in [facility], as well as of the fact that they are under no obligation to receive care at this facility.  PASARR submitted to EDS on 04/19/2013 PASARR number received from EDS on 04/19/2013  FL2 transmitted to all facilities in geographic area requested by pt/family on  04/19/2013 FL2 transmitted to all facilities within larger geographic area on   Patient informed that his/her managed care company has contracts with or will negotiate with  certain facilities, including the following:     Patient/family informed of bed offers received:   Patient chooses bed at  Physician recommends and patient chooses bed at    Patient to be transferred to  on   Patient to be transferred to facility by   The following physician request were entered in Epic:   Additional Comments:

## 2013-04-19 NOTE — Progress Notes (Signed)
Clinical Social Work Department BRIEF PSYCHOSOCIAL ASSESSMENT 04/19/2013  Patient:  Dylan [name], Dylan [name]     Account Number:  [id number]     Admit date:  04/12/2013  Clinical Social Worker:  Hendricks Milo  Date/Time:  04/19/2013 11:07 AM  Referred by:  Physician  Date Referred:  04/17/2013 Referred for  SNF Placement   Other Referral:   Interview type:  Family Other interview type:    PSYCHOSOCIAL DATA Living Status:  WITH DISABLED ADULT Admitted from facility:   Level of care:   Primary support name:  Dylan [name] [phone] Primary support relationship to patient:  CHILD, ADULT Degree of support available:   Very supportive.    CURRENT CONCERNS  Other Concerns:    SOCIAL WORK ASSESSMENT / PLAN Clinical Social Worker (CSW) contacted patient's daughter Dylan [name] to complete assessment because per chart patient is not oriented times 4. Daughter reported that patient lives with her younger sister who got hit by a car when she was 77 y.o and is disabled. Daughter reported that she lives 15 minutes away from the patient and chekcks on him daily. Daughter was agreeable to SNF search as a back up to CIR in [facility] and [place]. Daughter's first choice is [facility] because it is close to her house. CSW explained the difference between CIR and SNF. Daughter verbalized her understanding.   Assessment/plan status:  Psychosocial Support/Ongoing Assessment of Needs Other assessment/ plan:   Information/referral to community resources:    PATIENT'S/FAMILY'S RESPONSE TO PLAN OF CARE: Daughter thanked CSW for calling and helping with a back up plan to CIR.

## 2013-04-20 LAB — GLUCOSE, CAPILLARY
Glucose-Capillary: 158 mg/dL — ABNORMAL HIGH (ref 70–99)
Glucose-Capillary: 151 mg/dL — ABNORMAL HIGH (ref 70–99)
Glucose-Capillary: 201 mg/dL — ABNORMAL HIGH (ref 70–99)

## 2013-04-20 MED ORDER — HALOPERIDOL LACTATE 5 MG/ML IJ SOLN: 2.5000 mg | Freq: Once | INTRAMUSCULAR | Status: DC

## 2013-04-20 MED ORDER — HALOPERIDOL LACTATE 5 MG/ML IJ SOLN
2.5000 mg | Freq: Once | INTRAMUSCULAR | Status: AC
  Administered 2013-04-20: 2.5 mg via INTRAMUSCULAR
  Filled 2013-04-20: qty 1
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
## 2013-04-20 NOTE — Consult Note (Signed)
[facility] Face-to-Face Psychiatry Consult   Reason for Consult:  Confusion; Excessive alcohol use for 6 months.  Referring Physician: Raechel Chute, M.D.   Dylan [name] is an 79 y.o. male.  Assessment: AXIS I:  Alchol Use disorder, Severe AXIS II:  No diagnosis AXIS III:   Past Medical History  Diagnosis Date  . Hyperlipidemia   . Diabetes mellitus     Type II  . Cholelithiasis     which has resolved, apparently  . Anemia     diffuse  . COPD (chronic obstructive pulmonary disease)   . Pneumonia   . AAA (abdominal aortic aneurysm) 02/11/07  . Stroke 1972    CVA  . Glaucoma   . Gout   . Brain aneurysm     hx of ruptured aneurysm in 70's  . Hypertension     dr Cornelia Copa     . Chronic kidney disease     chronic renal insufficiency   AXIS IV:  other psychosocial or environmental problems AXIS V:  21-30 behavior considerably influenced by delusions or hallucinations OR serious impairment in judgment, communication OR inability to function in almost all areas  Plan:  Continue detox. He may not be medically appropriate for detox in a geropsych unit. Will follow patient.  May require placement in higher care facility, daughter instructed to speak to social worker about this. Continue detox protocol  Subjective:   Dylan [name] is a 79 y.o. male admitted for alcohol intoxication and confusion  HPI: Psychiatry consultation follow up note. Psychiatry was initially consulted to assess this patient with Altered mental status secondary to alcohol intoxication.  He is a poor historian. Patient stated that he has been drinking and falling. He has been depressed and denied being suicidal or homicidal ideations. He has no evidence of seizures or psychosis.  . Location: Patient has been drinking excessively prior to his wife's death and worsened in the past  . Quality: stayed in his chair with chest brace. . Severity: severe . Duration: Unknown  . Timing:Unable to assess. . Contex:Death of  his wife  from cancer. . Modifying factors: Unable to assess. . Associated signs and symptoms: Unable to assess.  Collateral information from his daughter reveals that the patient stays at home with his daughter who is on disability.  Patient's daughter Dylan [name] is his power of attorney and check on him daily.  The patient reports that the patient has been drinking and she found 9-10 bottles and would driving to the store for beer as well. Dylan [name] reports the patient appears to have worsened cognitively.   Past Psychiatric History: Past Medical History  Diagnosis Date  . Hyperlipidemia   . Diabetes mellitus     Type II  . Cholelithiasis     which has resolved, apparently  . Anemia     diffuse  . COPD (chronic obstructive pulmonary disease)   . Pneumonia   . AAA (abdominal aortic aneurysm) 02/11/07  . Stroke 1972    CVA  . Glaucoma   . Gout   . Brain aneurysm     hx of ruptured aneurysm in 70's  . Hypertension     dr Cornelia Copa     . Chronic kidney disease     chronic renal insufficiency    reports that he quit smoking about 44 years ago. His smoking use included Cigarettes. He has a 25 pack-year smoking history. He quit smokeless tobacco use about 41 years ago. He reports that he drinks about 0.5  ounces of alcohol per week. He reports that he does not use illicit drugs. Family History  Problem Relation Age of Onset  . Coronary artery disease Brother   . Anesthesia problems Neg Hx   . Hypotension Neg Hx   . Malignant hyperthermia Neg Hx   . Pseudochol deficiency Neg Hx   . Cancer Mother     BREAST     Living Arrangements: Children   Abuse/Neglect [facility]) Physical Abuse: Denies Verbal Abuse: Denies Sexual Abuse: Denies Allergies:   Allergies  Allergen Reactions  . Percocet [Oxycodone-Acetaminophen] Itching, Rash and Other (See Comments)    Very disoriented, hallucinations  . Hydrocodone-Acetaminophen Itching and Rash    ACT Assessment Complete:  Yes:    Educational Status     Risk to Self: Risk to self Is patient at risk for suicide?: No Substance abuse history and/or treatment for substance abuse?: No  Risk to Others:    Abuse: Abuse/Neglect Assessment (Assessment to be complete while patient is alone) Physical Abuse: Denies Verbal Abuse: Denies Sexual Abuse: Denies Exploitation of patient/patient's resources: Denies Self-Neglect: Denies  Prior Inpatient Therapy:  Unknown  Prior Outpatient Therapy:   Unknown  Additional Information:   Unknown   Objective: Blood pressure 104/48, pulse 80, temperature 97.8 F (36.6 C), temperature source Oral, resp. rate 18, height 5\' 9"  (1.753 m), weight 84 kg (185 lb 3 oz), SpO2 99.00%.Body mass index is 27.33 kg/(m^2). Results for orders placed during the hospital encounter of 04/12/13 (from the past 72 hour(s))  GLUCOSE, CAPILLARY     Status: Abnormal   Collection Time    04/17/13  4:45 PM      Result Value Range   Glucose-Capillary 112 (*) 70 - 99 mg/dL  GLUCOSE, CAPILLARY     Status: Abnormal   Collection Time    04/17/13  9:40 PM      Result Value Range   Glucose-Capillary 154 (*) 70 - 99 mg/dL   Comment 1 Documented in Chart     Comment 2 Notify RN    BASIC METABOLIC PANEL     Status: Abnormal   Collection Time    04/18/13  6:40 AM      Result Value Range   Sodium 139  135 - 145 mEq/L   Potassium 4.7  3.5 - 5.1 mEq/L   Comment: HEMOLYSIS AT THIS LEVEL MAY AFFECT RESULT   Chloride 102  96 - 112 mEq/L   CO2 24  19 - 32 mEq/L   Glucose, Bld 148 (*) 70 - 99 mg/dL   BUN 33 (*) 6 - 23 mg/dL   Creatinine, Ser 1.61 (*) 0.50 - 1.35 mg/dL   Calcium 9.6  8.4 - 09.6 mg/dL   GFR calc non Af Amer 32 (*) >90 mL/min   GFR calc Af Amer 37 (*) >90 mL/min   Comment: (NOTE)     The eGFR has been calculated using the CKD EPI equation.     This calculation has not been validated in all clinical situations.     eGFR's persistently <90 mL/min signify possible Chronic Kidney     Disease.  GLUCOSE, CAPILLARY     Status:  Abnormal   Collection Time    04/18/13  6:46 AM      Result Value Range   Glucose-Capillary 150 (*) 70 - 99 mg/dL   Comment 1 Documented in Chart     Comment 2 Notify RN    GLUCOSE, CAPILLARY     Status: Abnormal   Collection  Time    04/18/13 11:40 AM      Result Value Range   Glucose-Capillary 153 (*) 70 - 99 mg/dL  GLUCOSE, CAPILLARY     Status: Abnormal   Collection Time    04/18/13  4:36 PM      Result Value Range   Glucose-Capillary 124 (*) 70 - 99 mg/dL  GLUCOSE, CAPILLARY     Status: Abnormal   Collection Time    04/18/13  9:39 PM      Result Value Range   Glucose-Capillary 223 (*) 70 - 99 mg/dL   Comment 1 Documented in Chart     Comment 2 Notify RN    GLUCOSE, CAPILLARY     Status: Abnormal   Collection Time    04/19/13  6:51 AM      Result Value Range   Glucose-Capillary 167 (*) 70 - 99 mg/dL  GLUCOSE, CAPILLARY     Status: Abnormal   Collection Time    04/19/13 11:33 AM      Result Value Range   Glucose-Capillary 157 (*) 70 - 99 mg/dL  GLUCOSE, CAPILLARY     Status: Abnormal   Collection Time    04/19/13  4:05 PM      Result Value Range   Glucose-Capillary 218 (*) 70 - 99 mg/dL  GLUCOSE, CAPILLARY     Status: Abnormal   Collection Time    04/19/13 10:19 PM      Result Value Range   Glucose-Capillary 150 (*) 70 - 99 mg/dL   Comment 1 Notify RN     Comment 2 Documented in Chart    GLUCOSE, CAPILLARY     Status: Abnormal   Collection Time    04/20/13  6:40 AM      Result Value Range   Glucose-Capillary 158 (*) 70 - 99 mg/dL  GLUCOSE, CAPILLARY     Status: Abnormal   Collection Time    04/20/13 11:27 AM      Result Value Range   Glucose-Capillary 233 (*) 70 - 99 mg/dL   Comment 1 Documented in Chart     Labs are reviewed.  Current Facility-Administered Medications  Medication Dose Route Frequency Provider Last Rate Last Dose  . acetaminophen (TYLENOL) tablet 650 mg  650 mg Oral Q4H PRN Therisa Doyne, MD      . allopurinol (ZYLOPRIM) tablet  100 mg  100 mg Oral QHS Therisa Doyne, MD   100 mg at 04/19/13 2102  . amLODipine (NORVASC) tablet 10 mg  10 mg Oral q morning - 10a Therisa Doyne, MD   10 mg at 04/20/13 1017  . atorvastatin (LIPITOR) tablet 40 mg  40 mg Oral q1800 Therisa Doyne, MD   40 mg at 04/19/13 1754  . clopidogrel (PLAVIX) tablet 75 mg  75 mg Oral Q breakfast Ulice Dash, PA-C   75 mg at 04/20/13 4540  . folic acid (FOLVITE) tablet 1 mg  1 mg Oral Daily Therisa Doyne, MD   1 mg at 04/20/13 1017  . food thickener (THICK IT) powder   Oral PRN Rhetta Mura, MD      . levothyroxine (SYNTHROID, LEVOTHROID) tablet 25 mcg  25 mcg Oral QAC breakfast Therisa Doyne, MD   25 mcg at 04/20/13 0824  . multivitamin with minerals tablet 1 tablet  1 tablet Oral Daily Therisa Doyne, MD   1 tablet at 04/20/13 1017  . potassium chloride SA (K-DUR,KLOR-CON) CR tablet 20 mEq  20 mEq Oral Once [employer], DO      .  thiamine (VITAMIN B-1) tablet 100 mg  100 mg Oral Daily Therisa Doyne, MD   100 mg at 04/20/13 1017    Psychiatric Specialty Exam:     Blood pressure 104/48, pulse 80, temperature 97.8 F (36.6 C), temperature source Oral, resp. rate 18, height 5\' 9"  (1.753 m), weight 84 kg (185 lb 3 oz), SpO2 99.00%.Body mass index is 27.33 kg/(m^2).  General Appearance: Disheveled and Lying in bed. Sitter reports he attempts to uncover himself  Eye Contact::  Poor  Speech:  Garbled and Slow  Volume:  Decreased  Mood:  "all right."  Affect:  Blunt  Thought Process:  Disorganized  Orientation:  Other:  Oriented to person only.  Thought Content:  No paranoia or delusions  Suicidal Thoughts:  No  Homicidal Thoughts:  No  Memory:  Immediate;   Poor Recent;   Poor Remote;   Poor  Judgement:  Impaired  Insight:  Lacking  Psychomotor Activity:  Decreased  Concentration:  Poor  Recall:  Poor  Akathisia:  No  AIMS (if indicated):   Not indicated  Assets:  Others:  NOne  Sleep:       Treatment Plan Summary: Daily contact with patient to assess and evaluate symptoms and progress in treatment Medication management Will follow this patient. Patient does not meet acute psych hospitalization Patient has no alcohol withdrawal symptoms Recommends out patient psychiatric services May benefit from out of home placement due to drinking and falls Recommend his driver's license is suspended Daughter has patient's car keys and will remove car from May call [phone] if needs further service.  Dylan [name],JANARDHAHA R. 04/20/2013 12:36 PM

## 2013-04-20 NOTE — Progress Notes (Signed)
Occupational Therapy Treatment Patient Details Name: Dylan [name] MRN: [id number] DOB: 1930/07/17 Today's Date: 04/20/2013 Time: 4098-1191 OT Time Calculation (min): 10 min  OT Assessment / Plan / Recommendation  History of present illness pt presents with multiple recent falls resulting in Rt 5th rib fx, T11-12 fxs, multiple contusions, and MRI showing Acute Nonhemmorhagic R Frontal Infarct and remote L Lentiform Nucleus, TBI CHI and Bil Thalami Infarcts.     OT comments  Pt demonstrates poor recall of injuries and reason for admission. Pt unable to recall any information covered in previous session by SLP and PT today. Pt needed to be reeducated on all correct orientation.  Follow Up Recommendations  CIR    Barriers to Discharge       Equipment Recommendations  Other (comment)    Recommendations for Other Services Rehab consult  Frequency Min 2X/week   Progress towards OT Goals Progress towards OT goals: Progressing toward goals  Plan Discharge plan remains appropriate    Precautions / Restrictions Precautions Precautions: Fall Required Braces or Orthoses: Spinal Brace Spinal Brace: Thoracolumbosacral orthotic   Pertinent Vitals/Pain None reported    ADL  ADL Comments: Session focused on cognition- Pt unable to verbalize reason for admission. pt states "just a little this and that" Pt unable to verbalize family presnt in room - son in law. Pt did not recognize the use of a telephone. pt verbalized hearing the phone but could not problem solve to answer the phone. pt did not recognize object as a phone even after OT educated patient it was a phone. pt (A)ed answering the phone. pt asked by caller "tell me your name" Pt states "you dont remember my name?" pt needed redirection by caller to provide his name and date of birth. pt verbalized dob as [birth year] but needed extended time repetition to recall the year as 1932. Pt demonstrates slow processing. pt becoming liable at the end of  session. Pt unable to verbalize to therapist why he was upset. Pt declined OOB at this time.     OT Diagnosis:    OT Problem List:   OT Treatment Interventions:     OT Goals(current goals can now be found in the care plan section) Acute Rehab OT Goals Patient Stated Goal: None stated.   OT Goal Formulation: With patient/family Time For Goal Achievement: 04/29/13 Potential to Achieve Goals: Good ADL Goals Pt Will Perform Eating: with set-up;sitting Pt Will Perform Grooming: with set-up;standing Pt Will Perform Upper Body Bathing: with set-up;sitting Pt Will Perform Lower Body Bathing: with set-up;sit to/from stand Pt Will Transfer to Toilet: with supervision;bedside commode  Visit Information  Last OT Received On: 04/20/13 Assistance Needed: +1 History of Present Illness: pt presents with multiple recent falls resulting in Rt 5th rib fx, T11-12 fxs, multiple contusions, and MRI showing Acute Nonhemmorhagic R Frontal Infarct and remote L Lentiform Nucleus, TBI CHI and Bil Thalami Infarcts.      Subjective Data      Prior Functioning       Cognition  Cognition Arousal/Alertness: Awake/alert Behavior During Therapy: WFL for tasks assessed/performed Overall Cognitive Status: Impaired/Different from baseline Area of Impairment: Orientation;Attention;Memory;Safety/judgement;Awareness;Problem solving;Rancho level Orientation Level: Disoriented to;Place;Time;Situation Current Attention Level: Sustained Memory: Decreased recall of precautions;Decreased short-term memory Following Commands: Follows one step commands with increased time Safety/Judgement: Decreased awareness of safety;Decreased awareness of deficits Awareness: Emergent;Anticipatory Problem Solving: Slow processing;Difficulty sequencing Rancho Levels of Cognitive Functioning Rancho Los Amigos Scales of Cognitive Functioning: Confused/appropriate    Mobility  Bed  Mobility Bed Mobility: Not  assessed Transfers Transfers: Not assessed    Exercises      Balance     End of Session OT - End of Session Activity Tolerance: Patient tolerated treatment well Patient left: in bed;with call bell/phone within reach;with family/visitor present Nurse Communication: Mobility status;Precautions  GO     Harolyn Rutherford 04/20/2013, 3:26 PM Pager: [phone]
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
## 2013-04-20 NOTE — Progress Notes (Signed)
Rehab admissions - Evaluated for possible admission.  I briefly spoke with patient.  I called and left a message with his daughter.  I will open the case for acute inpatient rehab admission with [facility].  I will follow up when I hear back from the daughter and from the insurance carrier.  Call me for questions.  #[phone]
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
## 2013-04-20 NOTE — Clinical Social Work Psych Note (Signed)
Psych CSW reviewed chart and disposition with MD.  Pt was evaluated by psych on 04/17/2013 though no disposition was given.  Psych CSW has contacted MD to request re-consult for psychiatry to assist with disposition.  Pt will need to be evaluated for geri-psych inpatient placement vs. Outpatient follow-up.  MD aware and agreeable.    Vickii Penna, LCSWA [phone]  Clinical Social Work
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
## 2013-04-21 LAB — BASIC METABOLIC PANEL
CO2: 25 mEq/L (ref 19–32)
Chloride: 103 mEq/L (ref 96–112)
Creatinine, Ser: 1.89 mg/dL — ABNORMAL HIGH (ref 0.50–1.35)
GFR calc Af Amer: 37 mL/min — ABNORMAL LOW (ref >90)
Potassium: 3.9 mEq/L (ref 3.5–5.1)
Sodium: 139 mEq/L (ref 135–145)

## 2013-04-21 LAB — GLUCOSE, CAPILLARY
Glucose-Capillary: 154 mg/dL — ABNORMAL HIGH (ref 70–99)
Glucose-Capillary: 181 mg/dL — ABNORMAL HIGH (ref 70–99)
Glucose-Capillary: 207 mg/dL — ABNORMAL HIGH (ref 70–99)
Glucose-Capillary: 186 mg/dL — ABNORMAL HIGH (ref 70–99)

## 2013-04-21 NOTE — Progress Notes (Signed)
SLP Cancellation Note  Patient Details Name: Dylan [name] MRN: [id number] DOB: [birth year]   Cancelled treatment:       Reason Eval/Treat Not Completed: Fatigue/lethargy limiting ability to participate   Donavan Burnet, MS [facility] SLP [phone]

## 2013-04-21 NOTE — Progress Notes (Signed)
Occupational Therapy Treatment Patient Details Name: Dylan [name] MRN: [id number] DOB: 1930/09/12 Today's Date: 04/21/2013 Time: 7829-5621 OT Time Calculation (min): 13 min  OT Assessment / Plan / Recommendation  History of present illness pt presents with multiple recent falls resulting in Rt 5th rib fx, T11-12 fxs, multiple contusions, and MRI showing Acute Nonhemmorhagic R Frontal Infarct and remote L Lentiform Nucleus, TBI CHI and Bil Thalami Infarcts.     OT comments  Pt progressing slowly with OT this session. Pt remains with cognitive deficits and liable during session. Pt per son has had a hard time accepting the loss of his wife this year. Question - depression ?? Pt liable but reports  He is fine and nothing is wrong when asked in detail.  Follow Up Recommendations  CIR    Barriers to Discharge       Equipment Recommendations  Other (comment)    Recommendations for Other Services Rehab consult  Frequency Min 2X/week   Progress towards OT Goals Progress towards OT goals: Progressing toward goals  Plan Discharge plan remains appropriate    Precautions / Restrictions Precautions Precautions: Fall Required Braces or Orthoses: Spinal Brace Spinal Brace: Thoracolumbosacral orthotic Restrictions Weight Bearing Restrictions: No   Pertinent Vitals/Pain None reported Happy at end of session sitting in chair watching prices right and waiting lunch arrival    ADL  Grooming: Wash/dry hands;Set up (wash cloth in chair for hand hygiene) Where Assessed - Grooming: Supported sitting Upper Body Dressing: Minimal assistance;+1 Total assistance (total (A) to don TLSO/ min (A) to don gown) Where Assessed - Upper Body Dressing: Unsupported sitting Toilet Transfer: Minimal assistance Toilet Transfer Method: Stand pivot Equipment Used: Back brace;Other (comment) (hand held (A)) Transfers/Ambulation Related to ADLs: Pt with hand held (A) to transfer to the left to the chair ADL  Comments: Pt remains with cognition deficits inability to verbalize injuries or reason for admission. Pt agreeable to OOB once educated on pending lunch arrival. Pt provided blanket in chair. OT states "who brought you this blanket?" pt states "wife" and starts to cry. Not sure if due to [facility] if patient thought OT said Bought vs brought. Pt tearful and reports "nothing " when asked if he was okay. Pt very emotional when thoughts of his wife occur. Pt with loss of wife this year. Pt total (A) with back brace and no recall of brace. Pt remains strong CIR candidate    OT Diagnosis:    OT Problem List:   OT Treatment Interventions:     OT Goals(current goals can now be found in the care plan section) Acute Rehab OT Goals Patient Stated Goal: None stated.   OT Goal Formulation: With patient/family Time For Goal Achievement: 04/29/13 Potential to Achieve Goals: Good ADL Goals Pt Will Perform Eating: with set-up;sitting Pt Will Perform Grooming: with set-up;standing Pt Will Perform Upper Body Bathing: with set-up;sitting Pt Will Perform Lower Body Bathing: with set-up;sit to/from stand Pt Will Transfer to Toilet: with supervision;bedside commode  Visit Information  Last OT Received On: 04/21/13 Assistance Needed: +1 History of Present Illness: pt presents with multiple recent falls resulting in Rt 5th rib fx, T11-12 fxs, multiple contusions, and MRI showing Acute Nonhemmorhagic R Frontal Infarct and remote L Lentiform Nucleus, TBI CHI and Bil Thalami Infarcts.      Subjective Data      Prior Functioning       Cognition  Cognition Arousal/Alertness: Awake/alert Behavior During Therapy: Flat affect (liable) Overall Cognitive Status: Impaired/Different from baseline  Area of Impairment: Attention;Orientation;Awareness;Rancho level Orientation Level: Disoriented to;Time;Situation Current Attention Level: Sustained Memory: Decreased short-term memory Following Commands: Follows one step  commands inconsistently Safety/Judgement: Decreased awareness of deficits Awareness: Emergent;Anticipatory Problem Solving: Slow processing General Comments: pt with decreased arousal today affecting cognition.  pt is able to be aroused, but needs cueing to maintain.   Rancho Levels of Cognitive Functioning [place]igos Scales of Cognitive Functioning: Confused/appropriate    Mobility  Bed Mobility Bed Mobility: Supine to Sit;Sitting - Scoot to Edge of Bed Left Sidelying to Sit: 2: Max assist Supine to Sit: 4: Min assist;HOB flat;With rails Sitting - Scoot to Edge of Bed: 4: Min guard Details for Bed Mobility Assistance: needed (A) to sequence but able to perform task at min (A) level Transfers Transfers: Sit to Stand;Stand to Sit Sit to Stand: 4: Min assist;With upper extremity assist;From bed Stand to Sit: 4: Min assist;With upper extremity assist;To bed Details for Transfer Assistance: uncontrolled desend to chair    Exercises      Balance Balance Balance Assessed: No   End of Session OT - End of Session Activity Tolerance: Patient tolerated treatment well Patient left: in chair;with call bell/phone within reach Nurse Communication: Mobility status;Precautions  GO     Dylan [name] 04/21/2013, 12:16 PM Pager: [phone]
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
## 2013-04-21 NOTE — Progress Notes (Signed)
Physical Therapy Treatment Patient Details Name: Dylan [name] MRN: [id number] DOB: Nov 13, 1930 Today's Date: 04/21/2013 Time: 7829-5621 PT Time Calculation (min): 24 min  PT Assessment / Plan / Recommendation  History of Present Illness pt presents with multiple recent falls resulting in Rt 5th rib fx, T11-12 fxs, multiple contusions, and MRI showing Acute Nonhemmorhagic R Frontal Infarct and remote L Lentiform Nucleus, TBI CHI and Bil Thalami Infarcts.     PT Comments   Pt with decreased arousal today affecting cognition and mobility.  Spoke with RN about attempting to keep pt more awake today to encourage more rest at night.  Pt continues to be very pleasant and attempts all things PT asks.  Pt with difficulty in bathroom when asked to wash his face he begins to wash the counter and sink with wash cloth.  Will continue to follow.    Follow Up Recommendations  CIR     Does the patient have the potential to tolerate intense rehabilitation     Barriers to Discharge        Equipment Recommendations  Rolling walker with 5" wheels    Recommendations for Other Services    Frequency Min 3X/week   Progress towards PT Goals Progress towards PT goals: Progressing toward goals  Plan Current plan remains appropriate    Precautions / Restrictions Precautions Precautions: Fall Required Braces or Orthoses: Spinal Brace Spinal Brace: Thoracolumbosacral orthotic Restrictions Weight Bearing Restrictions: No   Pertinent Vitals/Pain Denied pain.      Mobility  Bed Mobility Bed Mobility: Left Sidelying to Sit;Sitting - Scoot to Edge of Bed Left Sidelying to Sit: 2: Max assist Sitting - Scoot to [place] of Bed: 2: Max assist Details for Bed Mobility Assistance: pt required increased A 2/2 decreased arousal.   Transfers Transfers: Sit to Stand;Stand to Sit Sit to Stand: 3: Mod assist;With upper extremity assist;From bed Stand to Sit: 4: Min assist;With upper extremity assist;To bed Details for  Transfer Assistance: pt slightly more awake while sitting EOB and able to A with mobility.   Ambulation/Gait Ambulation/Gait Assistance: 4: Min assist Ambulation Distance (Feet): 150 Feet Assistive device: 1 person hand held assist Ambulation/Gait Assistance Details: pt remains unsteady and with 1 LOB requiring Max A to prevent fall.  pt stumbling more today and more unsteady due to decreased arousal.   Gait Pattern: Step-through pattern;Decreased stride length;Shuffle;Wide base of support Stairs: No Wheelchair Mobility Wheelchair Mobility: No    Exercises     PT Diagnosis:    PT Problem List:   PT Treatment Interventions:     PT Goals (current goals can now be found in the care plan section) Acute Rehab PT Goals Patient Stated Goal: None stated.   Time For Goal Achievement: 04/29/13 Potential to Achieve Goals: Good  Visit Information  Last PT Received On: 04/21/13 Assistance Needed: +1 History of Present Illness: pt presents with multiple recent falls resulting in Rt 5th rib fx, T11-12 fxs, multiple contusions, and MRI showing Acute Nonhemmorhagic R Frontal Infarct and remote L Lentiform Nucleus, TBI CHI and Bil Thalami Infarcts.      Subjective Data  Patient Stated Goal: None stated.     Cognition  Cognition Arousal/Alertness: Lethargic Behavior During Therapy: Flat affect Overall Cognitive Status: Impaired/Different from baseline Area of Impairment: Orientation;Attention;Memory;Safety/judgement;Awareness;Problem solving;Rancho level Orientation Level: Disoriented to;Place;Time;Situation Current Attention Level: Focused Memory: Decreased short-term memory Following Commands: Follows one step commands inconsistently Safety/Judgement: Decreased awareness of safety;Decreased awareness of deficits Awareness: Emergent;Anticipatory Problem Solving: Slow processing;Decreased initiation;Difficulty sequencing;Requires  verbal cues;Requires tactile cues General Comments: pt with  decreased arousal today affecting cognition.  pt is able to be aroused, but needs cueing to maintain.   Rancho Levels of Cognitive Functioning Rancho Los Amigos Scales of Cognitive Functioning: Confused/appropriate    Balance  Balance Balance Assessed: No  End of Session PT - End of Session Equipment Utilized During Treatment: Gait belt;Back brace Activity Tolerance: Patient limited by fatigue Patient left: in bed;with call bell/phone within reach;with bed alarm set Nurse Communication: Mobility status   GP     Sunny Schlein, [place] [phone] 04/21/2013, 9:23 AM

## 2013-04-21 NOTE — Progress Notes (Signed)
Inpatient Diabetes Program Recommendations  AACE/ADA: New Consensus Statement on Inpatient Glycemic Control (2013)  Target Ranges:  Prepandial:   less than 140 mg/dL      Peak postprandial:   less than 180 mg/dL (1-2 hours)      Critically ill patients:  140 - 180 mg/dL   Results for LEQUAN, DOBRATZ (MRN [id number]) as of 04/21/2013 14:32  Ref. Range 04/20/2013 06:40 04/20/2013 11:27 04/20/2013 16:12 04/20/2013 22:40 04/21/2013 06:57 04/21/2013 11:09  Glucose-Capillary Latest Range: 70-99 mg/dL 409 (H) 811 (H) 914 (H) 201 (H) 154 (H) 181 (H)    Inpatient Diabetes Program Recommendations Correction (SSI): Please consider ordering Novolog sensitive correction ACHS.  Note: Patient has a history of diabetes and takes Glipizide 5 mg QAM and Actos 30 mg QHS as an outpatient for diabetes management.  Currently, patient is not ordered to receive any medications for inpatient glycemic control.  However, blood glucose is being checked ACHS.  On 11/26, Novolog sensitive correction scale was ordered ACHS but it was discontinued on 11/30.  Blood glucose on 12/1 ranged from 151-233 mg/dl.  Please consider re-ordering Novolog sensitive correction scale ACHS.  Will continue to follow.  Thanks, Orlando Penner, RN, MSN, CCRN Diabetes Coordinator Inpatient Diabetes Program [phone] (Team Pager) [phone] ([facility] office) [phone] [facility] office)

## 2013-04-22 ENCOUNTER — Encounter (HOSPITAL_COMMUNITY): Payer: Self-pay | Admitting: *Deleted

## 2013-04-22 ENCOUNTER — Inpatient Hospital Stay (HOSPITAL_COMMUNITY)
Admission: RE | Admit: 2013-04-22 | Discharge: 2013-05-05 | DRG: 945 | Disposition: A | Discharge status: Skilled Nursing Facility | Payer: PRIVATE HEALTH INSURANCE | Source: Intra-hospital | Attending: Physical Medicine & Rehabilitation | Admitting: Physical Medicine & Rehabilitation

## 2013-04-22 DIAGNOSIS — I6350 Cerebral infarction due to unspecified occlusion or stenosis of unspecified cerebral artery: Secondary | ICD-10-CM | POA: Diagnosis not present

## 2013-04-22 DIAGNOSIS — I872 Venous insufficiency (chronic) (peripheral): Secondary | ICD-10-CM | POA: Diagnosis not present

## 2013-04-22 DIAGNOSIS — G9340 Encephalopathy, unspecified: Secondary | ICD-10-CM | POA: Diagnosis not present

## 2013-04-22 DIAGNOSIS — E039 Hypothyroidism, unspecified: Secondary | ICD-10-CM | POA: Diagnosis not present

## 2013-04-22 DIAGNOSIS — Z79899 Other long term (current) drug therapy: Secondary | ICD-10-CM | POA: Diagnosis not present

## 2013-04-22 DIAGNOSIS — D649 Anemia, unspecified: Secondary | ICD-10-CM

## 2013-04-22 DIAGNOSIS — E119 Type 2 diabetes mellitus without complications: Secondary | ICD-10-CM | POA: Diagnosis present

## 2013-04-22 DIAGNOSIS — M109 Gout, unspecified: Secondary | ICD-10-CM | POA: Diagnosis not present

## 2013-04-22 DIAGNOSIS — F1010 Alcohol abuse, uncomplicated: Secondary | ICD-10-CM

## 2013-04-22 DIAGNOSIS — E785 Hyperlipidemia, unspecified: Secondary | ICD-10-CM | POA: Diagnosis not present

## 2013-04-22 DIAGNOSIS — S2239XA Fracture of one rib, unspecified side, initial encounter for closed fracture: Secondary | ICD-10-CM | POA: Diagnosis not present

## 2013-04-22 DIAGNOSIS — Z87891 Personal history of nicotine dependence: Secondary | ICD-10-CM

## 2013-04-22 DIAGNOSIS — Z5189 Encounter for other specified aftercare: Secondary | ICD-10-CM | POA: Diagnosis present

## 2013-04-22 DIAGNOSIS — R1310 Dysphagia, unspecified: Secondary | ICD-10-CM | POA: Diagnosis not present

## 2013-04-22 DIAGNOSIS — K5900 Constipation, unspecified: Secondary | ICD-10-CM

## 2013-04-22 DIAGNOSIS — I714 Abdominal aortic aneurysm, without rupture, unspecified: Secondary | ICD-10-CM

## 2013-04-22 DIAGNOSIS — N179 Acute kidney failure, unspecified: Secondary | ICD-10-CM | POA: Diagnosis not present

## 2013-04-22 DIAGNOSIS — I129 Hypertensive chronic kidney disease with stage 1 through stage 4 chronic kidney disease, or unspecified chronic kidney disease: Secondary | ICD-10-CM | POA: Diagnosis not present

## 2013-04-22 DIAGNOSIS — E1165 Type 2 diabetes mellitus with hyperglycemia: Secondary | ICD-10-CM

## 2013-04-22 DIAGNOSIS — I6330 Cerebral infarction due to thrombosis of unspecified cerebral artery: Secondary | ICD-10-CM

## 2013-04-22 DIAGNOSIS — Z8673 Personal history of transient ischemic attack (TIA), and cerebral infarction without residual deficits: Secondary | ICD-10-CM | POA: Diagnosis not present

## 2013-04-22 DIAGNOSIS — N189 Chronic kidney disease, unspecified: Secondary | ICD-10-CM

## 2013-04-22 DIAGNOSIS — Z9181 History of falling: Secondary | ICD-10-CM

## 2013-04-22 DIAGNOSIS — J4489 Other specified chronic obstructive pulmonary disease: Secondary | ICD-10-CM

## 2013-04-22 DIAGNOSIS — Z7982 Long term (current) use of aspirin: Secondary | ICD-10-CM | POA: Diagnosis not present

## 2013-04-22 DIAGNOSIS — S22009A Unspecified fracture of unspecified thoracic vertebra, initial encounter for closed fracture: Secondary | ICD-10-CM

## 2013-04-22 DIAGNOSIS — J449 Chronic obstructive pulmonary disease, unspecified: Secondary | ICD-10-CM

## 2013-04-22 DIAGNOSIS — R410 Disorientation, unspecified: Secondary | ICD-10-CM | POA: Diagnosis present

## 2013-04-22 DIAGNOSIS — I10 Essential (primary) hypertension: Secondary | ICD-10-CM

## 2013-04-22 DIAGNOSIS — I639 Cerebral infarction, unspecified: Secondary | ICD-10-CM | POA: Diagnosis present

## 2013-04-22 LAB — GLUCOSE, CAPILLARY
Glucose-Capillary: 171 mg/dL — ABNORMAL HIGH (ref 70–99)
Glucose-Capillary: 170 mg/dL — ABNORMAL HIGH (ref 70–99)
Glucose-Capillary: 208 mg/dL — ABNORMAL HIGH (ref 70–99)

## 2013-04-22 MED ORDER — LEVOTHYROXINE SODIUM 25 MCG PO TABS
25.0000 ug | ORAL_TABLET | Freq: Every day | ORAL | Status: DC
  Administered 2013-04-23 – 2013-05-05 (×12): 25 ug via ORAL
  Filled 2013-04-22 (×15): qty 1

## 2013-04-22 MED ORDER — INSULIN ASPART 100 UNIT/ML ~~LOC~~ SOLN
0.0000 [IU] | Freq: Every day | SUBCUTANEOUS | Status: DC
  Administered 2013-04-22 – 2013-04-28 (×2): 2 [IU] via SUBCUTANEOUS

## 2013-04-22 MED ORDER — ENOXAPARIN SODIUM 30 MG/0.3ML ~~LOC~~ SOLN
30.0000 mg | SUBCUTANEOUS | Status: DC
  Administered 2013-04-22 – 2013-05-04 (×12): 30 mg via SUBCUTANEOUS
  Filled 2013-04-22 (×14): qty 0.3

## 2013-04-22 MED ORDER — GUAIFENESIN-DM 100-10 MG/5ML PO SYRP: 5.0000 mL | ORAL_SOLUTION | Freq: Four times a day (QID) | ORAL | Status: DC | PRN

## 2013-04-22 MED ORDER — FOLIC ACID 1 MG PO TABS: 1.0000 mg | ORAL_TABLET | Freq: Every day | ORAL | Status: AC

## 2013-04-22 MED ORDER — ALUM & MAG HYDROXIDE-SIMETH 200-200-20 MG/5ML PO SUSP: 30.0000 mL | ORAL | Status: DC | PRN

## 2013-04-22 MED ORDER — FLEET ENEMA 7-19 GM/118ML RE ENEM: 1.0000 | ENEMA | Freq: Once | RECTAL | Status: AC | PRN

## 2013-04-22 MED ORDER — PROCHLORPERAZINE MALEATE 5 MG PO TABS
5.0000 mg | ORAL_TABLET | Freq: Four times a day (QID) | ORAL | Status: DC | PRN
  Filled 2013-04-22: qty 2

## 2013-04-22 MED ORDER — CYANOCOBALAMIN 2000 MCG PO TABS: 2000.0000 ug | ORAL_TABLET | Freq: Every day | ORAL | Status: AC

## 2013-04-22 MED ORDER — SENNOSIDES-DOCUSATE SODIUM 8.6-50 MG PO TABS
1.0000 | ORAL_TABLET | Freq: Every evening | ORAL | Status: DC | PRN
  Administered 2013-04-26: 1 via ORAL
  Filled 2013-04-22: qty 1

## 2013-04-22 MED ORDER — BISACODYL 10 MG RE SUPP: 10.0000 mg | Freq: Every day | RECTAL | Status: DC | PRN

## 2013-04-22 MED ORDER — THIAMINE HCL 100 MG PO TABS: 100.0000 mg | ORAL_TABLET | Freq: Every day | ORAL | Status: AC

## 2013-04-22 MED ORDER — VITAMIN B-1 100 MG PO TABS
100.0000 mg | ORAL_TABLET | Freq: Every day | ORAL | Status: DC
  Administered 2013-04-23 – 2013-05-05 (×13): 100 mg via ORAL
  Filled 2013-04-22 (×15): qty 1

## 2013-04-22 MED ORDER — ATORVASTATIN CALCIUM 40 MG PO TABS
40.0000 mg | ORAL_TABLET | Freq: Every day | ORAL | Status: DC
  Administered 2013-04-22 – 2013-05-04 (×13): 40 mg via ORAL
  Filled 2013-04-22 (×14): qty 1

## 2013-04-22 MED ORDER — FOLIC ACID 1 MG PO TABS
1.0000 mg | ORAL_TABLET | Freq: Every day | ORAL | Status: DC
  Administered 2013-04-23 – 2013-05-05 (×13): 1 mg via ORAL
  Filled 2013-04-22 (×14): qty 1

## 2013-04-22 MED ORDER — INSULIN ASPART 100 UNIT/ML ~~LOC~~ SOLN
0.0000 [IU] | Freq: Three times a day (TID) | SUBCUTANEOUS | Status: DC
  Administered 2013-04-22: 18:00:00 via SUBCUTANEOUS
  Administered 2013-04-23 (×2): 2 [IU] via SUBCUTANEOUS
  Administered 2013-04-24: 1 [IU] via SUBCUTANEOUS
  Administered 2013-04-24 (×2): 2 [IU] via SUBCUTANEOUS
  Administered 2013-04-25 (×2): 1 [IU] via SUBCUTANEOUS
  Administered 2013-04-25: 2 [IU] via SUBCUTANEOUS
  Administered 2013-04-26: 1 [IU] via SUBCUTANEOUS
  Administered 2013-04-26 – 2013-04-27 (×3): 2 [IU] via SUBCUTANEOUS
  Administered 2013-04-27 – 2013-04-28 (×3): 1 [IU] via SUBCUTANEOUS
  Administered 2013-04-28: 3 [IU] via SUBCUTANEOUS
  Administered 2013-04-28 – 2013-04-29 (×2): 2 [IU] via SUBCUTANEOUS
  Administered 2013-04-29: 1 [IU] via SUBCUTANEOUS
  Administered 2013-04-29: 2 [IU] via SUBCUTANEOUS
  Administered 2013-04-30 (×2): 1 [IU] via SUBCUTANEOUS
  Administered 2013-05-01: 2 [IU] via SUBCUTANEOUS
  Administered 2013-05-02: 1 [IU] via SUBCUTANEOUS
  Administered 2013-05-02: 2 [IU] via SUBCUTANEOUS
  Administered 2013-05-02: 3 [IU] via SUBCUTANEOUS
  Administered 2013-05-03: 2 [IU] via SUBCUTANEOUS
  Administered 2013-05-04 – 2013-05-05 (×3): 1 [IU] via SUBCUTANEOUS
  Administered 2013-05-05: 3 [IU] via SUBCUTANEOUS

## 2013-04-22 MED ORDER — AMLODIPINE BESYLATE 10 MG PO TABS
10.0000 mg | ORAL_TABLET | Freq: Every morning | ORAL | Status: DC
  Administered 2013-04-23 – 2013-05-05 (×12): 10 mg via ORAL
  Filled 2013-04-22 (×13): qty 1

## 2013-04-22 MED ORDER — CLOPIDOGREL BISULFATE 75 MG PO TABS: 75.0000 mg | ORAL_TABLET | Freq: Every day | ORAL | Status: AC

## 2013-04-22 MED ORDER — ACETAMINOPHEN 325 MG PO TABS
325.0000 mg | ORAL_TABLET | ORAL | Status: DC | PRN
  Administered 2013-04-28 – 2013-05-01 (×2): 650 mg via ORAL
  Filled 2013-04-22 (×2): qty 2

## 2013-04-22 MED ORDER — CLOPIDOGREL BISULFATE 75 MG PO TABS
75.0000 mg | ORAL_TABLET | Freq: Every day | ORAL | Status: DC
  Administered 2013-04-23 – 2013-05-05 (×13): 75 mg via ORAL
  Filled 2013-04-22 (×14): qty 1

## 2013-04-22 MED ORDER — PROCHLORPERAZINE EDISYLATE 5 MG/ML IJ SOLN
5.0000 mg | Freq: Four times a day (QID) | INTRAMUSCULAR | Status: DC | PRN
  Filled 2013-04-22: qty 2

## 2013-04-22 MED ORDER — PROCHLORPERAZINE 25 MG RE SUPP
12.5000 mg | Freq: Four times a day (QID) | RECTAL | Status: DC | PRN
  Filled 2013-04-22: qty 1

## 2013-04-22 MED ORDER — ADULT MULTIVITAMIN W/MINERALS CH
1.0000 | ORAL_TABLET | Freq: Every day | ORAL | Status: DC
  Administered 2013-04-23 – 2013-05-05 (×13): 1 via ORAL
  Filled 2013-04-22 (×14): qty 1

## 2013-04-22 MED ORDER — ALLOPURINOL 100 MG PO TABS
100.0000 mg | ORAL_TABLET | Freq: Every day | ORAL | Status: DC
  Administered 2013-04-22 – 2013-05-04 (×13): 100 mg via ORAL
  Filled 2013-04-22 (×14): qty 1
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
## 2013-04-22 NOTE — Clinical Social Work Note (Signed)
CSW contacted Genie in CIR to inquire about status of pt possible admission.  Per Genie, pt will be admitted to CIR upon dc from medical floor.  CSW remains available to assist as necessary.  Vickii Penna, LCSWA [phone]  Clinical Social Work
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
## 2013-04-22 NOTE — Discharge Summary (Signed)
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 follow directions Oral Cavity - Dentition: Edentulous Oral Motor / Sensory Function: Impaired - see Bedside swallow  eval Self-Feeding Abilities: Needs assist Patient Positioning: Upright in chair Baseline Vocal Quality: Low vocal intensity Volitional Cough: Cognitively unable to elicit Volitional Swallow: Unable to elicit Anatomy: Within functional limits Pharyngeal Secretions: Not observed secondary MBS    Reason for Referral Objectively evaluate swallowing function   Oral Phase Oral Preparation/Oral Phase Oral Phase: Impaired Oral - Nectar Oral - Nectar Teaspoon: Reduced posterior propulsion;Holding of  bolus;Delayed oral transit;Lingual/palatal residue Oral - Nectar Cup: Reduced posterior propulsion;Holding of  bolus;Delayed oral transit;Lingual/palatal residue Oral - Solids Oral - Puree: Reduced posterior propulsion;Holding of  bolus;Delayed oral transit;Lingual/palatal residue   Pharyngeal Phase Pharyngeal Phase Pharyngeal Phase: Impaired Pharyngeal - Nectar Pharyngeal - Nectar Teaspoon: Reduced pharyngeal  peristalsis;Reduced epiglottic inversion;Reduced tongue  base  retraction;Reduced anterior laryngeal mobility;Reduced laryngeal  elevation;Reduced airway/laryngeal closure;Pharyngeal residue -  valleculae;Pharyngeal residue - pyriform sinuses Pharyngeal - Nectar Cup: Reduced pharyngeal peristalsis;Reduced  epiglottic inversion;Reduced tongue base retraction;Reduced  anterior laryngeal mobility;Reduced laryngeal elevation;Reduced  airway/laryngeal closure;Pharyngeal residue -  valleculae;Pharyngeal residue - pyriform  sinuses;Penetration/Aspiration during swallow Penetration/Aspiration details (nectar cup): Material enters  airway, CONTACTS cords then ejected out Pharyngeal - Solids  Pharyngeal - Puree: Reduced pharyngeal peristalsis;Reduced  epiglottic inversion;Reduced tongue base retraction;Reduced  anterior laryngeal mobility;Reduced laryngeal elevation;Reduced  airway/laryngeal closure;Pharyngeal residue -  valleculae;Pharyngeal residue - pyriform sinuses  Cervical Esophageal Phase    GO              DeBlois, Riley Nearing 04/17/2013, 10:53 AM    Mr Maxine Glenn Head/brain Wo Cm  04/13/2013   CLINICAL DATA:  Confusion.  Right arm tingling.  Recent falls.  EXAM: MRI HEAD WITHOUT CONTRAST  MRA HEAD WITHOUT CONTRAST  TECHNIQUE: Multiplanar, multiecho pulse sequences of the brain and surrounding structures were obtained without intravenous contrast. Angiographic images of the head were obtained using MRA technique without contrast.  COMPARISON:  CT head without contrast 04/12/2013.  FINDINGS: MRI HEAD FINDINGS  An acute punctate nonhemorrhagic infarct is present in the right frontal operculum. No hemorrhage or mass lesion is present. Advanced atrophy and diffuse white matter disease is present bilaterally. Remote lacunar infarcts are present in the left putamen bilateral thalami. There is a remote left PICA infarct.  The globes and orbits are intact. A fluid level is present in the right maxillary sinus. The remaining paranasal sinuses are clear. Leftward nasal septal  deviation is noted. The mastoid air cells are clear.  MRA HEAD FINDINGS  The internal carotid arteries demonstrate mild atherosclerotic irregularity in the distal cavernous and supra clinoid segments. There is no significant stenosis. The ICA termini are intact bilaterally. The A1 and M1 segments are normal. The left A1 segment is slightly dominant to the right. A small anterior communicating artery is present. The MCA bifurcations are intact. There is marked attenuation of MCA branch vessels bilaterally, left greater than right.  Atherosclerotic irregularity is present throughout the intracranial vertebral arteries bilaterally. A 50% stenosis is present in the mid left vertebral artery. The PICA origins are not visualized. The basilar artery is intact. Both posterior cerebral arteries originate from the basilar tip. There is attenuation of distal PCA branch vessels bilaterally.  IMPRESSION: 1. Acute punctate nonhemorrhagic cortical infarct involving the right frontal operculum. 2. Advanced atrophy and extensive white matter disease, likely related to diffuse moderate small vessel disease demonstrated on the MRA. 3. Remote lacunar infarcts involving the left lentiform nucleus and bilateral thalami. 4. Right maxillary sinusitis. These results will be called to the ordering clinician or representative by the Radiologist Assistant, and communication documented in the PACS Dashboard.   Electronically Signed   By: Gennette Pac M.D.   On: 04/13/2013 08:18    Microbiology: Recent Results (from the past 240 hour(s))  URINE CULTURE     Status: None   Collection Time    04/12/13  9:11 PM      Result Value Range Status   Specimen Description URINE, CLEAN CATCH   Final   Special Requests NONE   Final   Culture  Setup Time     Final   Value: 04/13/2013 03:30     Performed at [facility] Count     Final   Value: NO GROWTH     Performed at [facility]   Culture     Final   Value: NO  GROWTH     Performed at [facility]   Report Status 04/14/2013 FINAL   Final     Labs: Basic Metabolic Panel:  Recent Labs Lab 04/16/13 0607 04/18/13 0640 04/21/13 0412  NA 135 139 139  K 3.5 4.7 3.9  CL 98 102 103  CO2 29 24 25   GLUCOSE 152* 148* 185*  BUN 35* 33* 30*  CREATININE 1.83* 1.89* 1.89*  CALCIUM 9.6 9.6 9.1   Liver Function Tests:  Recent Labs Lab 04/16/13 0607  AST 19  ALT 12  ALKPHOS 43  BILITOT 0.5  PROT 6.8  ALBUMIN 2.9*   No results found for this basename: LIPASE, AMYLASE,  in the last 168 hours No results found for this basename: AMMONIA,  in the last 168 hours CBC: No results found for this basename: WBC, NEUTROABS, HGB, HCT, MCV, PLT,  in the last 168 hours Cardiac Enzymes: No results found for this basename: CKTOTAL, CKMB, CKMBINDEX, TROPONINI,  in the last 168 hours BNP: BNP (last 3 results) No results found for this basename: PROBNP,  in the last 8760 hours CBG:  Recent Labs Lab 04/21/13 1109 04/21/13 1616 04/21/13 2103 04/22/13 0639 04/22/13 1153  GLUCAP 181* 207* 186* 171* 170*       Signed:  Adalbert Alberto S  [facility] 04/22/2013, 1:23 PM

## 2013-04-22 NOTE — Progress Notes (Signed)
Received patient from [facility].  Patient alert to self only; vitals stable; denies pain.  Patient oriented to room and unit.  Will continue to monitor.
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
## 2013-04-22 NOTE — Progress Notes (Signed)
Rehab admissions - I have authorization for acute inpatient rehab admission.  I called daughter and let her know.  She is an agreement to inpatient rehab admission.  Bed available and can admit to acute inpatient rehab today.  Call me for questions.  #[phone]

## 2013-04-23 ENCOUNTER — Inpatient Hospital Stay (HOSPITAL_COMMUNITY): Payer: PRIVATE HEALTH INSURANCE | Admitting: Rehabilitation

## 2013-04-23 ENCOUNTER — Inpatient Hospital Stay (HOSPITAL_COMMUNITY): Payer: PRIVATE HEALTH INSURANCE | Admitting: Occupational Therapy

## 2013-04-23 ENCOUNTER — Inpatient Hospital Stay (HOSPITAL_COMMUNITY): Payer: PRIVATE HEALTH INSURANCE | Admitting: Speech Pathology

## 2013-04-23 DIAGNOSIS — I6330 Cerebral infarction due to thrombosis of unspecified cerebral artery: Secondary | ICD-10-CM

## 2013-04-23 DIAGNOSIS — G8110 Spastic hemiplegia affecting unspecified side: Secondary | ICD-10-CM

## 2013-04-23 LAB — COMPREHENSIVE METABOLIC PANEL
ALT: 13 U/L (ref 0–53)
AST: 23 U/L (ref 0–37)
BUN: 29 mg/dL — ABNORMAL HIGH (ref 6–23)
CO2: 24 mEq/L (ref 19–32)
Calcium: 9.3 mg/dL (ref 8.4–10.5)
Chloride: 100 mEq/L (ref 96–112)
Creatinine, Ser: 2.02 mg/dL — ABNORMAL HIGH (ref 0.50–1.35)
GFR calc Af Amer: 34 mL/min — ABNORMAL LOW (ref >90)
GFR calc non Af Amer: 29 mL/min — ABNORMAL LOW (ref >90)
Glucose, Bld: 178 mg/dL — ABNORMAL HIGH (ref 70–99)
Sodium: 135 mEq/L (ref 135–145)
Total Bilirubin: 0.4 mg/dL (ref 0.3–1.2)

## 2013-04-23 LAB — CBC WITH DIFFERENTIAL/PLATELET
Eosinophils Relative: 1 % (ref 0–5)
HCT: 32.5 % — ABNORMAL LOW (ref 39.0–52.0)
Hemoglobin: 10.6 g/dL — ABNORMAL LOW (ref 13.0–17.0)
Lymphocytes Relative: 16 % (ref 12–46)
Lymphs Abs: 1.1 10*3/uL (ref 0.7–4.0)
MCV: 105.5 fL — ABNORMAL HIGH (ref 78.0–100.0)
Monocytes Absolute: 0.5 10*3/uL (ref 0.1–1.0)
Monocytes Relative: 8 % (ref 3–12)
Neutro Abs: 4.9 10*3/uL (ref 1.7–7.7)
RBC: 3.08 MIL/uL — ABNORMAL LOW (ref 4.22–5.81)
WBC: 6.6 10*3/uL (ref 4.0–10.5)

## 2013-04-23 LAB — GLUCOSE, CAPILLARY
Glucose-Capillary: 181 mg/dL — ABNORMAL HIGH (ref 70–99)
Glucose-Capillary: 179 mg/dL — ABNORMAL HIGH (ref 70–99)

## 2013-04-23 MED ORDER — QUETIAPINE FUMARATE 25 MG PO TABS
25.0000 mg | ORAL_TABLET | Freq: Every day | ORAL | Status: DC
  Administered 2013-04-23 – 2013-05-01 (×9): 25 mg via ORAL
  Filled 2013-04-23 (×10): qty 1

## 2013-04-23 MED ORDER — TRAZODONE HCL 50 MG PO TABS: 50.0000 mg | ORAL_TABLET | Freq: Once | ORAL | Status: DC

## 2013-04-23 MED ORDER — TRAZODONE HCL 50 MG PO TABS
50.0000 mg | ORAL_TABLET | Freq: Once | ORAL | Status: AC
  Administered 2013-04-23: 50 mg via ORAL
  Filled 2013-04-23: qty 1
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
## 2013-04-23 NOTE — Progress Notes (Signed)
Patient information reviewed and entered into eRehab system by Kenidy Crossland, RN, CRRN, PPS Coordinator.  Information including medical coding and functional independence measure will be reviewed and updated through discharge.     Per nursing patient was given "Data Collection Information Summary for Patients in Inpatient Rehabilitation Facilities with attached "Privacy Act Statement-Health Care Records" upon admission.  
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
## 2013-04-23 NOTE — Progress Notes (Signed)
Patient at desk from start of shift. Patient with poor safety awareness, restlessness and unable to sleep at HS. PA on call notified. Orders given for Trazodone 50 mg repeat in 2 hrs. adm

## 2013-04-23 NOTE — Progress Notes (Signed)
Orthopedic Tech Progress Note Patient Details:  Dylan [name] 1930-12-23 [id number]  Patient ID: Dylan [name], male   DOB: 17-Sep-1930, 79 y.o.   MRN: [id number]   Dylan [name] 04/23/2013, 12:31 PMCalled bio-tech for TLSO brace.

## 2013-04-23 NOTE — Progress Notes (Signed)
Occupational Therapy Session Note  Patient Details  Name: Dylan [name] MRN: [id number] Date of Birth: 25-Jun-1930  Today's Date: 04/23/2013 Time: 1333-1400 Time Calculation (min): 27 min  Short Term Goals: Week 1:  OT Short Term Goal 1 (Week 1): Short term goals equal to long term goals set at supervision level overall based on LOS.  Skilled Therapeutic Interventions/Progress Updates:    Pt worked on functional mobility and awareness during session.  Pt not oriented to place, time, or situation this session.  Easy to re-direct with mod instructional cueing.  Pt able to ambulate to the day room with hand held assist on the right side.  Pt with noted brace liner pulled out of the front of the TLSO at beginning of session.  Removed TLSO and replaced with total assist.  Pt unable to assist with this and demonstrates no awareness when asked why he has to wear the brace.  He was also unable to recall what this therapist had worked on with him earlier this morning (B/D).    Therapy Documentation Precautions:  Precautions Precautions: Fall;Back Precaution Comments: Pt will need constant education on back precautions, as well as family education Required Braces or Orthoses: Spinal Brace Spinal Brace: Thoracolumbosacral orthotic;Applied in sitting position Restrictions Weight Bearing Restrictions: No  Pain: Pain Assessment Pain Assessment: No/denies pain  See FIM for current functional status  Therapy/Group: Individual Therapy  Kobe Ofallon OTR/L 04/23/2013, 3:20 PM

## 2013-04-23 NOTE — Plan of Care (Signed)
Problem: RH COGNITION-NURSING Goal: RH STG USES MEMORY AIDS/STRATEGIES W/ASSIST TO PROBLEM SOLVE STG Uses Memory Aids/Strategies With min Assistance to Problem Solve.  Outcome: Not Progressing Patient with short term memory deficits/ cues needed to sequence tasks. adm
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
## 2013-04-23 NOTE — Care Management Note (Signed)
[facility] Individual Statement of Services  Patient Name:  Dylan [name]  Date:  04/23/2013  Welcome to the [facility].  Our goal is to provide you with an individualized program based on your diagnosis and situation, designed to meet your specific needs.  With this comprehensive rehabilitation program, you will be expected to participate in at least 3 hours of rehabilitation therapies Monday-Friday, with modified therapy programming on the weekends.  Your rehabilitation program will include the following services:  Physical Therapy (PT), Occupational Therapy (OT), Speech Therapy (ST), 24 hour per day rehabilitation nursing, Neuropsychology, Case Management (Social Worker), Rehabilitation Medicine, Nutrition Services and Pharmacy Services  Weekly team conferences will be held on Wednesday to discuss your progress.  Your Social Worker will talk with you frequently to get your input and to update you on team discussions.  Team conferences with you and your family in attendance may also be held.  Expected length of stay: 9 days Overall anticipated outcome: supervision/min level  Depending on your progress and recovery, your program may change. Your Social Worker will coordinate services and will keep you informed of any changes. Your Social Worker's name and contact numbers are listed  below.  The following services may also be recommended but are not provided by the [facility]:   Driving Evaluations  Home Health Rehabiltiation Services  Outpatient Rehabilitation Services   Arrangements will be made to provide these services after discharge if needed.  Arrangements include referral to agencies that provide these services.  Your insurance has been verified to be:  [employer]-Medicare & Medicaid Your primary doctor is:  Dr Annie Sable  Pertinent information will be shared with your doctor and your insurance company.  Social  Worker:  Dossie Der, SW [phone] or ([phone]  Information discussed with and copy given to patient by: Lucy Chris, 04/23/2013, 2:18 PM

## 2013-04-23 NOTE — Plan of Care (Signed)
Problem: RH SAFETY Goal: RH STG ADHERE TO SAFETY PRECAUTIONS W/ASSISTANCE/DEVICE STG Adhere to Safety Precautions With min Assistance/Device.  Outcome: Not Progressing Patient with poor safety awareness.

## 2013-04-24 ENCOUNTER — Inpatient Hospital Stay (HOSPITAL_COMMUNITY): Payer: PRIVATE HEALTH INSURANCE | Admitting: Rehabilitation

## 2013-04-24 ENCOUNTER — Inpatient Hospital Stay (HOSPITAL_COMMUNITY): Payer: PRIVATE HEALTH INSURANCE

## 2013-04-24 ENCOUNTER — Encounter (HOSPITAL_COMMUNITY): Payer: PRIVATE HEALTH INSURANCE | Admitting: Occupational Therapy

## 2013-04-24 ENCOUNTER — Inpatient Hospital Stay (HOSPITAL_COMMUNITY): Payer: PRIVATE HEALTH INSURANCE | Admitting: Occupational Therapy

## 2013-04-24 DIAGNOSIS — G8110 Spastic hemiplegia affecting unspecified side: Secondary | ICD-10-CM

## 2013-04-24 DIAGNOSIS — I6330 Cerebral infarction due to thrombosis of unspecified cerebral artery: Secondary | ICD-10-CM

## 2013-04-24 DIAGNOSIS — R5381 Other malaise: Secondary | ICD-10-CM

## 2013-04-24 LAB — GLUCOSE, CAPILLARY
Glucose-Capillary: 164 mg/dL — ABNORMAL HIGH (ref 70–99)
Glucose-Capillary: 148 mg/dL — ABNORMAL HIGH (ref 70–99)
Glucose-Capillary: 154 mg/dL — ABNORMAL HIGH (ref 70–99)
Glucose-Capillary: 158 mg/dL — ABNORMAL HIGH (ref 70–99)

## 2013-04-24 NOTE — Progress Notes (Signed)
Occupational Therapy Session Note  Patient Details  Name: Dylan [name] MRN: [id number] Date of Birth: Jan 08, 1931  Today's Date: 04/24/2013 Time: 0900-1000 Time Calculation (min): 60 min  Second Session: Time:  11:03-11:30 Time Calculation (min):  27 mins  Short Term Goals: Week 1:  OT Short Term Goal 1 (Week 1): Short term goals equal to long term goals set at supervision level overall based on LOS.  Skilled Therapeutic Interventions/Progress Updates:    Session 1:  Pt performed bathing and dressing during session sit to stand at the sink.  Pt with decreased sustained attention to task this session compared to previous session.  Noted pt frequently closing eyes and needed constant verbal stimuli to stay awake.  Pt perseverated on washing his face numerous times and when told to wash his arms he would continually go back to washing his face.  Therapist assisted with shaving as pt donned shaving cream and then attempted to wipe it off without any remembrance of why he put it on.  Attempted to integrate AE for LB selfcare but pt's attention level too limited to understand and use.  Therapist assisted with donning gripper socks as well as donning underpants.  Pt placed at the nursing station for safety with belt in place.  Session 2:  Pt found asleep at the nurses station.  Very difficulty to arouse for session.  Took pt back to the room and therapist used a cold wash cloth to wash his face.  Attempted to have pt comb his hair however he would not keep his eyes open or hold the comb when presented to him.  Instead had pt stand and perform functional transfers and mobility with min hand held assist in hopes of working on his dynamic balance but also to increase his arousal level and attention.  Pt able to walk from his room to the elevators with mod instructional cueing and min assist.  Took 2-3 min rest break at the elevators before walking back to his room.  Once in the room pt was slightly more  alert so therapist again gave him the comb and he began to comb his hair.  Decreased thoroughness noted with attempted combing and eventually pt's eyes closed again.  Concluded session by taking pt to [employer] for his next therapy via the wheelchair.  Therapy Documentation Precautions:  Precautions Precautions: Fall;Back Precaution Comments: Pt will need constant education on back precautions, as well as family education Required Braces or Orthoses: Spinal Brace Spinal Brace: Thoracolumbosacral orthotic;Applied in sitting position Restrictions Weight Bearing Restrictions: No  Pain: Pain Assessment Pain Assessment: No/denies pain ADL: See FIM for current functional status  Therapy/Group: Individual Therapy  Tauriel Scronce OTR/L 04/24/2013, 12:36 PM

## 2013-04-24 NOTE — Plan of Care (Signed)
Problem: RH SAFETY Goal: RH STG ADHERE TO SAFETY PRECAUTIONS W/ASSISTANCE/DEVICE STG Adhere to Safety Precautions With min Assistance/Device.  Outcome: Not Progressing Patient with poor safety awareness brought to nurses station entire shift.adm

## 2013-04-24 NOTE — Progress Notes (Addendum)
Physical Therapy Note  Patient Details  Name: Dylan [name] MRN: [id number] Date of Birth: 1931-03-26 Today's Date: 04/24/2013  AM session:Pt received at nursing station, noted to be asleep but would arouse intermittently with voice.  Assisted down to therapy gym to attempt further arousal, however he was unable to wake enough for therapy.  Attempted loud vocalization, increased touch, and cold wash cloth to assist with increasing arousal.  Pt did not wake up, therefore assisted back to nursing station with quick release belt in place.  RN aware.    PM session:  Pt received lying in bed asleep.  Adamantly refused to get OOB, stating "I will after a while."  Therefore allowed pt approx 15 mins before returning to room.  However he continued to refuse to get OOB to participate in therapy, despite max encouragement and attempting to pull covers off of pt. Left pt in bed with bed alarm set and call bell in reach.    [name] 04/24/2013, 10:59 AM
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
## 2013-04-24 NOTE — IPOC Note (Deleted)
Overall Plan of Care [facility]) Patient Details Name: Dylan [name] MRN: [id number] DOB: 10-29-1930  Admitting Diagnosis: RT CVA  Hospital Problems: Active Problems:   CVA (cerebral infarction)     Functional Problem List: Nursing Behavior;Medication Management;Motor;Safety;Skin Integrity  PT Balance;Endurance;Motor;Safety;Perception  OT Balance;Cognition;Safety  SLP Cognition;Nutrition;Safety;Linguistic  TR         Basic ADL's: OT Grooming;Bathing;Dressing;Toileting     Advanced  ADL's: OT Simple Meal Preparation     Transfers: PT Bed Mobility;Bed to Chair;Car  OT Toilet;Tub/Shower     Locomotion: PT Ambulation;Wheelchair Mobility;Stairs     Additional Impairments: OT None  SLP Swallowing;Communication;Social Cognition expression;comprehension Social Interaction;Problem Solving;Memory;Attention;Awareness  TR      Anticipated Outcomes Item Anticipated Outcome  Self Feeding independent  Swallowing  Min assist   Basic self-care  supervision  Toileting  supervision   Bathroom Transfers supervision  Bowel/Bladder  Continent of bowel and bladder  Transfers  supervision overall  Locomotion  supervision overall  Communication  Min assist   Cognition  Mod assist  Pain  </=3  Safety/Judgment  No falls with injury   Therapy Plan: PT Intensity: Minimum of 1-2 x/day ,45 to 90 minutes PT Frequency: 5 out of 7 days PT Duration Estimated Length of Stay: 9 days  OT Intensity: Minimum of 1-2 x/day, 45 to 90 minutes OT Frequency: 5 out of 7 days OT Duration/Estimated Length of Stay: 9 days SLP Intensity: Minumum of 1-2 x/day, 30 to 90 minutes SLP Frequency: 5 out of 7 days SLP Duration/Estimated Length of Stay: ~10 days       Team Interventions: Nursing Interventions Patient/Family Education;Bowel Management;Bladder Management;Disease Management/Prevention;Pain Management;Medication Management;Skin Care/Wound Management;Cognitive  Remediation/Compensation;Psychosocial Support  PT interventions Ambulation/gait training;Balance/vestibular training;Discharge planning;DME/adaptive equipment instruction;Functional mobility training;Neuromuscular re-education;Patient/family education;Stair training;Therapeutic Activities;Therapeutic Exercise;UE/LE Strength taining/ROM;UE/LE Coordination activities;Visual/perceptual remediation/compensation;Wheelchair propulsion/positioning  OT Interventions Balance/vestibular training;Cognitive Engineer, maintenance;Neuromuscular re-education;Self Care/advanced ADL retraining;Therapeutic Exercise;UE/LE Strength taining/ROM;Patient/family education;UE/LE Coordination activities;Therapeutic Activities;Psychosocial support;Discharge planning;Pain management  SLP Interventions Cognitive remediation/compensation;Cueing hierarchy;Dysphagia/aspiration precaution training;Environmental controls;Functional tasks;Patient/family education;Speech/Language facilitation;Internal/external aids;Therapeutic Activities  TR Interventions    SW/CM Interventions Discharge Planning;Psychosocial Support;Patient/Family Education    Team Discharge Planning: Destination: PT-Home ,OT- Home , SLP- (TBD) Projected Follow-up: PT-Home health PT;24 hour supervision/assistance, OT-  Home health OT, SLP-24 hour supervision/assistance;Home Health SLP;Skilled Nursing facility Projected Equipment Needs: PT-To be determined, OT- 3 in 1 bedside comode;Tub/shower bench, SLP-None recommended by SLP Equipment Details: PT- , OT-  Patient/family involved in discharge planning: PT- Patient,  OT-Patient, SLP-Patient unable/family or caregive not available  MD ELOS: 10 days Medical Rehab Prognosis:  Excellent Assessment: The patient has been admitted for CIR therapies. The team will be addressing, functional mobility, strength, stamina, balance, safety, adaptive techniques/equipment, self-care, bowel and  bladder mgt, patient and caregiver education, NMR, comunication, cognition, cognitive perceptual rx. Goals have been set at supervision for basic mobility and self-care and min to moderate assist for cognitoin and communication.    Ranelle Oyster, MD, FAAPMR      See Team Conference Notes for weekly updates to the plan of care
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
## 2013-04-24 NOTE — Progress Notes (Signed)
Occupational Therapy Session Note  Patient Details  Name: Dylan [name] MRN: [id number] Date of Birth: Jul 09, 1930  Today's Date: 04/24/2013 Time: 1150-1210 Time Calculation (min): 20 min  Skilled Therapeutic Interventions: Skilled co-treatment with SLP focused on swallowing and cognitive goals during [name] group activity. Patient required mod assist to initiate self-feeding due to somnolence which resolved after 5 minutes with facilitation to stimulate interest in activity and hand guidance to bring food to his mouth.    Patient's participation improved with visit from his daughter and granddaughter who provided verbal encouragement.   Patient required repeated cues during session to swallow each bite before introducing additional portions to his mouth.  Continue plan of care.      Therapy Documentation Precautions:  Precautions Precautions: Fall;Back Precaution Comments: Pt will need constant education on back precautions, as well as family education Required Braces or Orthoses: Spinal Brace Spinal Brace: Thoracolumbosacral orthotic;Applied in sitting position Restrictions Weight Bearing Restrictions: No  Pain: Pain Assessment Pain Assessment: No/denies pain  See FIM for current functional status  Therapy/Group: Group Therapy  Dylan [name] 04/24/2013, 4:09 PM

## 2013-04-24 NOTE — Progress Notes (Addendum)
Speech Language Pathology Daily Session Note  Patient Details  Name: Dylan [name] MRN: [id number] Date of Birth: 1931/02/17  Today's Date: 04/24/2013 Time: 1130-1150 Time Calculation (min): 20 min  Short Term Goals: Week 1: SLP Short Term Goal 1 (Week 1): STG=LTG due to length of stay  Skilled Therapeutic Interventions: Skilled co-treatment with OT focused on swallowing and cognitive goals. Pt very lethargic at the beginning of session with Max cues for alertness, however as alertness improved, pt was able to consume lunch meal consisting of Dys 1 textures and thin liquids with Min-Mod A. Intermittent, delayed coughing observed x3. Pt intermittently tearful, however easily redirected by daughter and granddaughter. Continue plan of care.    FIM:  Comprehension Comprehension Mode: Auditory Comprehension: 2-Understands basic 25 - 49% of the time/requires cueing 51 - 75% of the time Expression Expression Mode: Verbal Expression: 3-Expresses basic 50 - 74% of the time/requires cueing 25 - 50% of the time. Needs to repeat parts of sentences. Social Interaction Social Interaction: 2-Interacts appropriately 25 - 49% of time - Needs frequent redirection. Problem Solving Problem Solving: 1-Solves basic less than 25% of the time - needs direction nearly all the time or does not effectively solve problems and may need a restraint for safety Memory Memory: 1-Recognizes or recalls less than 25% of the time/requires cueing greater than 75% of the time FIM - Eating Eating Activity: 5: Needs verbal cues/supervision  Pain Pain Assessment Pain Assessment: No/denies pain  Therapy/Group: Group Therapy   Maxcine Ham, M.A. CCC-SLP [phone]   Maxcine Ham 04/24/2013, 1:21 PM

## 2013-04-24 NOTE — Progress Notes (Signed)
Patient slept (approx. 6 hrs.) in Geri chair at desk due to lack of sleep in 24 hours.adm

## 2013-04-25 ENCOUNTER — Encounter (HOSPITAL_COMMUNITY): Payer: PRIVATE HEALTH INSURANCE

## 2013-04-25 ENCOUNTER — Inpatient Hospital Stay (HOSPITAL_COMMUNITY): Payer: PRIVATE HEALTH INSURANCE | Admitting: Occupational Therapy

## 2013-04-25 ENCOUNTER — Inpatient Hospital Stay (HOSPITAL_COMMUNITY): Payer: PRIVATE HEALTH INSURANCE | Admitting: Physical Therapy

## 2013-04-25 DIAGNOSIS — I10 Essential (primary) hypertension: Secondary | ICD-10-CM

## 2013-04-25 DIAGNOSIS — G8110 Spastic hemiplegia affecting unspecified side: Secondary | ICD-10-CM

## 2013-04-25 DIAGNOSIS — S22009A Unspecified fracture of unspecified thoracic vertebra, initial encounter for closed fracture: Secondary | ICD-10-CM

## 2013-04-25 DIAGNOSIS — I6330 Cerebral infarction due to thrombosis of unspecified cerebral artery: Secondary | ICD-10-CM

## 2013-04-25 DIAGNOSIS — E1165 Type 2 diabetes mellitus with hyperglycemia: Secondary | ICD-10-CM

## 2013-04-25 DIAGNOSIS — R5381 Other malaise: Secondary | ICD-10-CM

## 2013-04-25 LAB — GLUCOSE, CAPILLARY
Glucose-Capillary: 131 mg/dL — ABNORMAL HIGH (ref 70–99)
Glucose-Capillary: 150 mg/dL — ABNORMAL HIGH (ref 70–99)

## 2013-04-25 NOTE — Progress Notes (Signed)
Occupational Therapy Session Note  Patient Details  Name: Dylan [name] MRN: [id number] Date of Birth: 01/13/1931  Today's Date: 04/25/2013 Time: 9562-1308 Time Calculation (min): 20 min  Skilled Therapeutic Interventions/Progress Updates: Mr. Haverland participated in diner's club today with appropriate socialization and made a joke.  He utilized bilateral UE to eat his food.  However, when looking for more food to eat, he initated eating the "Thickener" by putting his fork into it.  The rehab tech alerted the speech therapy who was able to stop him before eating it.    Therapy Documentation Precautions:  Precautions Precautions: Fall;Back Precaution Comments: Pt will need constant education on back precautions, as well as family education Required Braces or Orthoses: Spinal Brace Spinal Brace: Thoracolumbosacral orthotic;Applied in sitting position Restrictions Weight Bearing Restrictions: No Pain:denied   See FIM for current functional status  Therapy/Group: Group Therapy  Rozelle Logan 04/25/2013, 4:20 PM

## 2013-04-25 NOTE — Progress Notes (Signed)
Speech Language Pathology Daily Session Note  Patient Details  Name: Dylan [name] MRN: [id number] Date of Birth: August 31, 1930  Today's Date: 04/25/2013 Time: 1130-1146 Time Calculation (min): 15 min  Short Term Goals: Week 1: SLP Short Term Goal 1 (Week 1): STG=LTG due to length of stay  Skilled Therapeutic Interventions: Skilled co-treatment with OT focused on swallowing and cognitive goals. SLP facilitated session with Min cues for slow rate with lunch meal consisting of Dys 1 textures and thin liquids. Pt with baseline wet cough that was intermittently observed during meal. Pt required Mod question cues for orientation to day of the week. Pt attempted to eat thickening agent and SLP provided redirection to food already on his plate. Continue plan of care.   FIM:  Comprehension Comprehension Mode: Auditory Comprehension: 5-Understands basic 90% of the time/requires cueing < 10% of the time Expression Expression Mode: Verbal Expression: 5-Expresses basic 90% of the time/requires cueing < 10% of the time. Social Interaction Social Interaction: 3-Interacts appropriately 50 - 74% of the time - May be physically or verbally inappropriate. Problem Solving Problem Solving: 1-Solves basic less than 25% of the time - needs direction nearly all the time or does not effectively solve problems and may need a restraint for safety Memory Memory: 1-Recognizes or recalls less than 25% of the time/requires cueing greater than 75% of the time FIM - Eating Eating Activity: 5: Supervision/cues  Pain  No/denies pain  Therapy/Group: Group Therapy   Maxcine Ham, M.A. CCC-SLP [phone]   Maxcine Ham 04/25/2013, 4:20 PM
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
## 2013-04-25 NOTE — Progress Notes (Signed)
Physical Therapy Session Note  Patient Details  Name: TELL ROZELLE MRN: [id number] Date of Birth: 1931-04-23  Today's Date: 04/25/2013 Time: 1530-1600 Time Calculation (min): 30 min  Short Term Goals: Week 1:  PT Short Term Goal 1 (Week 1): =LTG's due to ELOS  Therapy Documentation Precautions:  Precautions Precautions: Fall;Back Precaution Comments: Pt will need constant education on back precautions, as well as family education Required Braces or Orthoses: Spinal Brace Spinal Brace: Thoracolumbosacral orthotic;Applied in sitting position Restrictions Weight Bearing Restrictions: No Pain: denies pain   Gait Training:(15') using RW 2 x 250' with  S/Mod-I verbal cues to clear R foot on swing phase. Therapeutic Exercise:(15') Nu-Step Level 6 x 10' using only LE's with TLSO brace on  Therapy/Group: Individual Therapy  Teneka Malmberg J 04/25/2013, 4:12 PM Ssss

## 2013-04-25 NOTE — Progress Notes (Signed)
Physical Therapy Session Note  Patient Details  Name: Dylan [name] MRN: [id number] Date of Birth: 07-05-1930  Today's Date: 04/25/2013 Time: 0900-0930 Time Calculation (min): 30 min  Short Term Goals: Week 1:  PT Short Term Goal 1 (Week 1): =LTG's due to ELOS  Therapy Documentation Precautions:  Precautions Precautions: Fall;Back Precaution Comments: Pt will need constant education on back precautions, as well as family education Required Braces or Orthoses: Spinal Brace Spinal Brace: Thoracolumbosacral orthotic;Applied in sitting position Restrictions Weight Bearing Restrictions: No Pain: no c/o pain with movement at R ribs and T-spine  Therapeutic Activity:(30') Bed Mobility rolling R and L, Mod-assist, multiple times with patient using bed rail and tactile cues for log roll technique during application of TLSO. Transfer L side lying into sitting EOB with Max-assist and static sitting at EOB with min-assist and patient using bed rail. Patient mumbling curses and obscenities at therapist and then wanting to get back to bed and resistant to up to recliner. Patient not purposefully participating throughout most of tx session. Assisted BTb and Total-assist x 2 to scoot to [facility] and reposition in supine. Bed alarm engaged and nursing call bell placed within reach.    Therapy/Group: Individual Therapy  Jodie Leiner J 04/25/2013, 9:06 AM

## 2013-04-25 NOTE — Progress Notes (Signed)
Occupational Therapy Session Note  Patient Details  Name: Dylan [name] MRN: [id number] Date of Birth: 01-09-1931  Today's Date: 04/25/2013 Time: 1430-1500 Time Calculation (min): 30 min  Skilled Therapeutic Interventions/Progress Updates: In the afternoon, Mr. Snider verbalized that he needed to toilet and completed the transfer with close S via w/c and grab bar (stood to void) and completed management of underwear and hospital gown (had no other clothes to don today) with close S.   He also completed UE strengthening to increase overall endurance for self care.     Therapy Documentation Precautions:  Precautions Precautions: Fall;Back Precaution Comments: Pt will need constant education on back precautions, as well as family education Required Braces or Orthoses: Spinal Brace Spinal Brace: Thoracolumbosacral orthotic;Applied in sitting position Restrictions Weight Bearing Restrictions: No   Pain:denied   See FIM for current functional status  Therapy/Group: Individual Therapy  Bud Face [facility] 04/25/2013, 4:35 PM

## 2013-04-25 NOTE — Progress Notes (Signed)
Occupational Therapy Session Note  Patient Details  Name: Dylan [name] MRN: [id number] Date of Birth: [birth year]  Today's Date: 04/25/2013 Time: 4098-1191 Time Calculation (min): 45 min  Skilled Therapeutic Interventions/Progress Updates: Patient seen for toileting and bathing and dressing in w/c at sink with overall focus on adhering to BAT back precautions.  Patient elected not to wash or dress feet. He only required a cue one time to not twist while trying to wash his buttocks as the TLSO restricted his reach.   Patient would benefit from more opportunities to demonstrate independence with AE to reach feet for washing and dressing.  He elected not to wash feet or lower legs today and had no clothes to wear; so, he did not use the AE today.    Therapy Documentation Precautions:  Precautions Precautions: Fall;Back Precaution Comments: Pt will need constant education on back precautions, as well as family education Required Braces or Orthoses: Spinal Brace Spinal Brace: Thoracolumbosacral orthotic;Applied in sitting position Restrictions Weight Bearing Restrictions: No  Pain:denied   See FIM for current functional status  Therapy/Group: Individual Therapy  Bud Face [facility] 04/25/2013, 4:05 PM

## 2013-04-26 LAB — GLUCOSE, CAPILLARY
Glucose-Capillary: 181 mg/dL — ABNORMAL HIGH (ref 70–99)
Glucose-Capillary: 130 mg/dL — ABNORMAL HIGH (ref 70–99)
Glucose-Capillary: 136 mg/dL — ABNORMAL HIGH (ref 70–99)
Glucose-Capillary: 159 mg/dL — ABNORMAL HIGH (ref 70–99)
Glucose-Capillary: 117 mg/dL — ABNORMAL HIGH (ref 70–99)

## 2013-04-26 MED ORDER — SORBITOL 70 % SOLN
30.0000 mL | Freq: Every day | Status: DC | PRN
  Administered 2013-04-26: 30 mL via ORAL
  Filled 2013-04-26: qty 30
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
## 2013-04-26 NOTE — Progress Notes (Signed)
Last charted BM 12/03, PRN sorbitol given on previous shift without results. 1 PRN Senna S given at 2144. BS active to all 4 quads. Patient tearful at night with increased confusion. Bed alarm in use.  Bilateral shins, medial aspect red, left > right. Patient without complaint of. Poor PO intake.  Dylan [name]

## 2013-04-27 ENCOUNTER — Inpatient Hospital Stay (HOSPITAL_COMMUNITY): Payer: PRIVATE HEALTH INSURANCE | Admitting: Rehabilitation

## 2013-04-27 ENCOUNTER — Inpatient Hospital Stay (HOSPITAL_COMMUNITY): Payer: PRIVATE HEALTH INSURANCE | Admitting: Speech Pathology

## 2013-04-27 ENCOUNTER — Encounter (HOSPITAL_COMMUNITY): Payer: PRIVATE HEALTH INSURANCE | Admitting: Occupational Therapy

## 2013-04-27 DIAGNOSIS — S22009A Unspecified fracture of unspecified thoracic vertebra, initial encounter for closed fracture: Secondary | ICD-10-CM

## 2013-04-27 DIAGNOSIS — I10 Essential (primary) hypertension: Secondary | ICD-10-CM

## 2013-04-27 DIAGNOSIS — E1165 Type 2 diabetes mellitus with hyperglycemia: Secondary | ICD-10-CM

## 2013-04-27 LAB — GLUCOSE, CAPILLARY
Glucose-Capillary: 134 mg/dL — ABNORMAL HIGH (ref 70–99)
Glucose-Capillary: 189 mg/dL — ABNORMAL HIGH (ref 70–99)

## 2013-04-27 LAB — BASIC METABOLIC PANEL
CO2: 23 mEq/L (ref 19–32)
Creatinine, Ser: 1.52 mg/dL — ABNORMAL HIGH (ref 0.50–1.35)
GFR calc non Af Amer: 42 mL/min — ABNORMAL LOW (ref >90)
Glucose, Bld: 133 mg/dL — ABNORMAL HIGH (ref 70–99)
Potassium: 3.8 mEq/L (ref 3.5–5.1)
Sodium: 139 mEq/L (ref 135–145)

## 2013-04-27 MED ORDER — SENNOSIDES-DOCUSATE SODIUM 8.6-50 MG PO TABS
2.0000 | ORAL_TABLET | Freq: Every day | ORAL | Status: DC
  Administered 2013-04-27 – 2013-05-04 (×8): 2 via ORAL
  Filled 2013-04-27 (×8): qty 2

## 2013-04-27 MED ORDER — FLEET ENEMA 7-19 GM/118ML RE ENEM
1.0000 | ENEMA | Freq: Once | RECTAL | Status: AC
  Administered 2013-04-27: 1 via RECTAL
  Filled 2013-04-27: qty 1

## 2013-04-27 MED ORDER — SENNOSIDES-DOCUSATE SODIUM 8.6-50 MG PO TABS: 1.0000 | ORAL_TABLET | Freq: Every day | ORAL | Status: DC

## 2013-04-27 MED ORDER — CITALOPRAM HYDROBROMIDE 10 MG PO TABS
10.0000 mg | ORAL_TABLET | Freq: Every day | ORAL | Status: DC
Start: 2013-04-27 — End: 2013-05-05
  Administered 2013-04-27 – 2013-05-05 (×9): 10 mg via ORAL
  Filled 2013-04-27 (×10): qty 1
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
## 2013-04-27 NOTE — Progress Notes (Signed)
Physical Therapy Session Note  Patient Details  Name: Dylan [name] MRN: [id number] Date of Birth: August 11, 1930  Today's Date: 04/27/2013 Time: 4098-1191 Time Calculation (min): 58 min  Short Term Goals: Week 1:  PT Short Term Goal 1 (Week 1): =LTG's due to ELOS  Skilled Therapeutic Interventions/Progress Updates:   Pt received sitting in w/c at nursing station and agreeable to therapy this morning.  Assisted to therapy gym to perform seated nu step x 4 mins BLE/UEs for increased strength and coordination with reciprocal pattern, progressing to BLEs only to increase strength in LLE x another 4 mins.  Performed first half at level 5 resistance then last half at level 4 resistance.  While performing seated nu step, asked pt orientation questions.  He was unable to pick out type of building from list of three, however when asked "are you in a hospital?" he did state "I might be."  He was able to tell me what city he was in and when asked what hospital, he was able to tell me [facility] was in [place].  He could not tell me why he was in hospital, therefore re-oriented to situation.  He then stated, "oh, yeah, I remember that part."  Progressed to ambulation with and without RW.  See details below.  Also performed car transfer at min assist with cues for maintaining back precautions (re-educated during session).  Performed high level gait/balance activity with kicking box in hallway to simulate weight shifting, increased WB through LLE, and modified SLS to improve balance.  Able to perform this activity x 80' x 2 reps.  Provided min assist (HHA) during this activity.  Then performed tapping to cone with focus on slow, controlled steps.  He requires mod assist at times to prevent overt LOB. Also provided max cues for decreased use of LUE on therapist to prevent LOB.  He did show improvement near end of activity.   Ambulated back to nursing station and left in w/c with quick release belt donned.    Also  performed stair training to simulate home entry.  See details below.   Therapy Documentation Precautions:  Precautions Precautions: Fall;Back Precaution Comments: Pt will need constant education on back precautions, as well as family education Required Braces or Orthoses: Spinal Brace Spinal Brace: Thoracolumbosacral orthotic;Applied in sitting position Restrictions Weight Bearing Restrictions: No   Vital Signs: Therapy Vitals BP: 120/58 mmHg Pain: Pain Assessment Pain Assessment: No/denies pain   Locomotion : Ambulation Ambulation: Yes Ambulation/Gait Assistance: 4: Min guard;4: Min assist Ambulation Distance (Feet): 150 Feet (>150' total) Assistive device: 1 person hand held assist;Rolling walker Ambulation/Gait Assistance Details: Verbal cues for sequencing;Verbal cues for technique;Verbal cues for precautions/safety;Verbal cues for gait pattern;Verbal cues for safe use of DME/AE;Manual facilitation for weight shifting Ambulation/Gait Assistance Details: Further assessed gait today with HHA initially >150' with pt demonstrating improved balance from last week.  Continue to provide cues for upright posture and increased step legnth on the L side.   Progressed to ambulation with RW at min/guard assist with cues for safety when turning with RW, maintaining position inside of RW, and maintaining upright posture.   Gait Gait: Yes Gait Pattern: Impaired Gait Pattern: Step-through pattern;Decreased stance time - left;Decreased dorsiflexion - left;Trunk flexed;Shuffle Stairs / Additional Locomotion Stairs: Yes Stairs Assistance: 3: Mod assist Stairs Assistance Details: Verbal cues for sequencing;Verbal cues for technique;Verbal cues for precautions/safety;Verbal cues for gait pattern;Manual facilitation for weight shifting;Manual facilitation for weight bearing Stairs Assistance Details (indicate cue type and reason):  Performed approx 10, 6" steps in stairwell with R hand rail.   Requires mod assist at times due to pts impulsivity. Provided max cues for step to pattern and attempted to educate on leading with RLE, however pt refused and continue to ascend/descend with LLE.   Stair Management Technique: One rail Right;Step to pattern;Forwards Number of Stairs: 10 Height of Stairs: 6   See FIM for current functional status  Therapy/Group: Individual Therapy  [name] 04/27/2013, 12:48 PM

## 2013-04-27 NOTE — Progress Notes (Signed)
Occupational Therapy Session Note  Patient Details  Name: WOODY KRONBERG MRN: [id number] Date of Birth: 1930-11-16  Today's Date: 04/27/2013 Time: 1145-1200 Time Calculation (min): 15 min  Short Term Goals: Week 1:  OT Short Term Goal 1 (Week 1): Short term goals equal to long term goals set at supervision level overall based on LOS.  Skilled Therapeutic Interventions/Progress Updates:  Pt participated in [employer] self feeding co-treatment group with SLP.  Patient required supervision verbal cues for slow rate with lunch meal consisting of Dys 1 textures and thin liquids. Patient pleasant yet did not initiate conversation. Patient ate all that was offered on his lunch tray except applesauce.   Therapy Documentation Precautions:  Precautions Precautions: Fall;Back Precaution Comments: Pt will need constant education on back precautions, as well as family education Required Braces or Orthoses: Spinal Brace Spinal Brace: Thoracolumbosacral orthotic;Applied in sitting position Restrictions Weight Bearing Restrictions: No Pain: Pain Assessment Pain Assessment: No/denies pain  Therapy/Group: Co-Treatment and Group Therapy  Quentavious Rittenhouse 04/27/2013, 3:21 PM

## 2013-04-27 NOTE — Plan of Care (Signed)
Problem: RH COGNITION-NURSING Goal: RH STG USES MEMORY AIDS/STRATEGIES W/ASSIST TO PROBLEM SOLVE STG Uses Memory Aids/Strategies With min Assistance to Problem Solve.  Outcome: Not Progressing Requires mod assiist

## 2013-04-27 NOTE — Progress Notes (Signed)
Occupational Therapy Session Note  Patient Details  Name: Dylan [name] MRN: [id number] Date of Birth: [birth year]  Today's Date: 04/27/2013 Time: 0900-1000 Time Calculation (min): 60 min  Short Term Goals: Week 1:  OT Short Term Goal 1 (Week 1): Short term goals equal to long term goals set at supervision level overall based on LOS.  Skilled Therapeutic Interventions/Progress Updates:    Pt performed bathing and dressing sit to stand on the EOB this session.  Pt still not oriented to place, time, or situation but was able to maintain selective attention during session and followed commands consistently.  No awareness of why he needs to wear the brace with total assist to donn it sitting EOB.  Max instructional cueing needed for him to follow back precautions when transitioning to the EOB as well as when performing LB dressing.  Pt utilized the reacher and sockaide for dressing.  Therapist assisted with washing feet as pt did not have access to LH sponge.  Pt ambulated to the bathroom and used the urinal in standing with min assist.    Therapy Documentation Precautions:  Precautions Precautions: Fall;Back Precaution Comments: Pt will need constant education on back precautions, as well as family education Required Braces or Orthoses: Spinal Brace Spinal Brace: Thoracolumbosacral orthotic;Applied in sitting position Restrictions Weight Bearing Restrictions: No  Pain: Pain Assessment Pain Assessment: No/denies pain ADL See FIM for current functional status  Therapy/Group: Individual Therapy  Onesty Clair OTR/L 04/27/2013, 12:17 PM

## 2013-04-27 NOTE — Progress Notes (Signed)
Speech Language Pathology Daily Session Note  Patient Details  Name: Dylan [name] MRN: [id number] Date of Birth: 10/09/1930  Today's Date: 04/27/2013 Time: 1315-1350 Time Calculation (min): 35 min  Short Term Goals: Week 1: SLP Short Term Goal 1 (Week 1): STG=LTG due to length of stay  Skilled Therapeutic Interventions: Skilled treatment session focused on addressing cognition goals.  SLP facilitated session with discussion regarding orientation with Mod cues for clarification and use of external information.  Patient declined initially getting up but did so with some encouragement but then declined p.o. trials.  Patient demonstrated less verbal and functional perseveration during speaking and writing tasks today; also with improved intelligibility.  Recommend to continue with current plan of care.       FIM:  Comprehension Comprehension Mode: Auditory Comprehension: 5-Understands basic 90% of the time/requires cueing < 10% of the time Expression Expression Mode: Verbal Expression: 5-Expresses basic 90% of the time/requires cueing < 10% of the time. Social Interaction Social Interaction: 4-Interacts appropriately 75 - 89% of the time - Needs redirection for appropriate language or to initiate interaction. Problem Solving Problem Solving: 1-Solves basic less than 25% of the time - needs direction nearly all the time or does not effectively solve problems and may need a restraint for safety Memory Memory: 1-Recognizes or recalls less than 25% of the time/requires cueing greater than 75% of the time FIM - Eating Eating Activity: 5: Supervision/cues  Pain Pain Assessment Pain Assessment: No/denies pain  Therapy/Group: Individual Therapy  Charlane Ferretti., CCC-SLP [phone]  [name] 04/27/2013, 4:50 PM

## 2013-04-27 NOTE — Progress Notes (Signed)
Speech Language Pathology Daily Session Note  Patient Details  Name: Dylan [name] MRN: [id number] Date of Birth: 1930-09-30  Today's Date: 04/27/2013 Time: 1130-1145 Time Calculation (min): 15 min  Short Term Goals: Week 1: SLP Short Term Goal 1 (Week 1): STG=LTG due to length of stay  Skilled Therapeutic Interventions: Pt participated in skilled co-treatment with OT in din[employer] club with focus on dysphagia and cognitive goals. SLP facilitated session with supervision verbal cues for slow rate with lunch meal consisting of Dys 1 textures and thin liquids. Pt with baseline wet cough that was observed X 1 at end of meal. Continue plan of care.    FIM:  Comprehension Comprehension Mode: Auditory Comprehension: 5-Understands basic 90% of the time/requires cueing < 10% of the time Expression Expression Mode: Verbal Expression: 5-Expresses basic 90% of the time/requires cueing < 10% of the time. Social Interaction Social Interaction: 4-Interacts appropriately 75 - 89% of the time - Needs redirection for appropriate language or to initiate interaction. Problem Solving Problem Solving: 1-Solves basic less than 25% of the time - needs direction nearly all the time or does not effectively solve problems and may need a restraint for safety Memory Memory: 1-Recognizes or recalls less than 25% of the time/requires cueing greater than 75% of the time FIM - Eating Eating Activity: 5: Supervision/cues  Pain Pain Assessment Pain Assessment: No/denies pain  Therapy/Group: Group Therapy  Trae Bovenzi 04/27/2013, 1:21 PM

## 2013-04-28 ENCOUNTER — Inpatient Hospital Stay (HOSPITAL_COMMUNITY): Payer: PRIVATE HEALTH INSURANCE | Admitting: Occupational Therapy

## 2013-04-28 ENCOUNTER — Inpatient Hospital Stay (HOSPITAL_COMMUNITY): Payer: PRIVATE HEALTH INSURANCE | Admitting: Speech Pathology

## 2013-04-28 ENCOUNTER — Inpatient Hospital Stay (HOSPITAL_COMMUNITY): Payer: PRIVATE HEALTH INSURANCE | Admitting: Rehabilitation

## 2013-04-28 ENCOUNTER — Encounter (HOSPITAL_COMMUNITY): Payer: PRIVATE HEALTH INSURANCE | Admitting: Occupational Therapy

## 2013-04-28 DIAGNOSIS — G8110 Spastic hemiplegia affecting unspecified side: Secondary | ICD-10-CM

## 2013-04-28 DIAGNOSIS — I10 Essential (primary) hypertension: Secondary | ICD-10-CM

## 2013-04-28 DIAGNOSIS — R5381 Other malaise: Secondary | ICD-10-CM

## 2013-04-28 DIAGNOSIS — E1165 Type 2 diabetes mellitus with hyperglycemia: Secondary | ICD-10-CM

## 2013-04-28 DIAGNOSIS — S22009A Unspecified fracture of unspecified thoracic vertebra, initial encounter for closed fracture: Secondary | ICD-10-CM

## 2013-04-28 DIAGNOSIS — I6330 Cerebral infarction due to thrombosis of unspecified cerebral artery: Secondary | ICD-10-CM

## 2013-04-28 LAB — GLUCOSE, CAPILLARY
Glucose-Capillary: 141 mg/dL — ABNORMAL HIGH (ref 70–99)
Glucose-Capillary: 235 mg/dL — ABNORMAL HIGH (ref 70–99)
Glucose-Capillary: 157 mg/dL — ABNORMAL HIGH (ref 70–99)
Glucose-Capillary: 225 mg/dL — ABNORMAL HIGH (ref 70–99)
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
## 2013-04-28 NOTE — Progress Notes (Signed)
Speech Language Pathology Daily Session Note  Patient Details  Name: Dylan [name] MRN: [id number] Date of Birth: [birth year]  Today's Date: 04/28/2013 Time: 1535-1600 Time Calculation (min): 25 min  Short Term Goals: Week 1: SLP Short Term Goal 1 (Week 1): STG=LTG due to length of stay  Skilled Therapeutic Interventions: Skilled treatment session focused on addressing dysphagia goals.  SLP facilitated session with trials of Dys.2 textures and thin liquids via cup with.  Patient demonstrated efficient mastication and no overt s/s of aspiration with Min cues for a slow pace of self-feeding.  Recommend trial tray upgrade tomorrow.  FIM:  Comprehension Comprehension Mode: Auditory Comprehension: 3-Understands basic 50 - 74% of the time/requires cueing 25 - 50%  of the time Expression Expression Mode: Verbal Expression: 5-Expresses basic needs/ideas: With extra time/assistive device Social Interaction Social Interaction: 4-Interacts appropriately 75 - 89% of the time - Needs redirection for appropriate language or to initiate interaction. Problem Solving Problem Solving: 2-Solves basic 25 - 49% of the time - needs direction more than half the time to initiate, plan or complete simple activities Memory Memory: 1-Recognizes or recalls less than 25% of the time/requires cueing greater than 75% of the time FIM - Eating Eating Activity: 5: Supervision/cues;5: Set-up assist for open containers  Pain Pain Assessment Pain Assessment: No/denies pain  Therapy/Group: Individual Therapy  Charlane Ferretti., CCC-SLP [phone]  [name] 04/28/2013, 4:40 PM
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
## 2013-04-28 NOTE — Progress Notes (Signed)
Speech Language Pathology Daily Session Note  Patient Details  Name: JEF FUTCH MRN: [id number] Date of Birth: 1931/02/07  Today's Date: 04/28/2013 Time: 1150-1205 Time Calculation (min): 15 min  Short Term Goals: Week 1: SLP Short Term Goal 1 (Week 1): STG=LTG due to length of stay  Skilled Therapeutic Interventions: Patient participated in skilled co-treatment with OT in [employer] with focus on dysphagia and cognitive goals. SLP facilitated session with skilled observation of Dys.1 textures and thin liquids with Supervision level verbal cues to problem solve self-feeding.  Patient demonstrated no overt s/s of aspiration throughout meal. Recommend trials of upgraded textures with SLP in individual session.  Continue plan of care.    FIM:  Comprehension Comprehension Mode: Auditory Comprehension: 4-Understands basic 75 - 89% of the time/requires cueing 10 - 24% of the time  Pain Pain Assessment Pain Assessment: No/denies pain  Therapy/Group: Group Therapy  Charlane Ferretti., CCC-SLP [phone]  Jarica Plass 04/28/2013, 1:52 PM

## 2013-04-28 NOTE — Progress Notes (Signed)
Occupational Therapy Note  Patient Details  Name: Dylan [name] MRN: [id number] Date of Birth: 06-Dec-1930 Today's Date: 04/28/2013 Time:  1140-1150 (10 mins)  Pt seen for skilled co-treat with SLP in [name] with focus on problem solving and safety with self-feeding. Pt required supervision with lunch meal consisting of Dys 1 textures and thin liquids with no verbal cues this session for slow rate.  Pt pleasant with minimal interaction with group members.  Pt with no c/o pain this session.  Rosalio Loud 04/28/2013, 3:08 PM
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
## 2013-04-29 ENCOUNTER — Inpatient Hospital Stay (HOSPITAL_COMMUNITY): Payer: PRIVATE HEALTH INSURANCE | Admitting: Rehabilitation

## 2013-04-29 ENCOUNTER — Inpatient Hospital Stay (HOSPITAL_COMMUNITY): Payer: PRIVATE HEALTH INSURANCE | Admitting: Speech Pathology

## 2013-04-29 ENCOUNTER — Encounter (HOSPITAL_COMMUNITY): Payer: PRIVATE HEALTH INSURANCE | Admitting: Occupational Therapy

## 2013-04-29 LAB — GLUCOSE, CAPILLARY: Glucose-Capillary: 151 mg/dL — ABNORMAL HIGH (ref 70–99)

## 2013-04-29 LAB — BASIC METABOLIC PANEL
CO2: 17 mEq/L — ABNORMAL LOW (ref 19–32)
Chloride: 102 mEq/L (ref 96–112)
Creatinine, Ser: 1.76 mg/dL — ABNORMAL HIGH (ref 0.50–1.35)
GFR calc Af Amer: 40 mL/min — ABNORMAL LOW (ref >90)
GFR calc non Af Amer: 35 mL/min — ABNORMAL LOW (ref >90)
Glucose, Bld: 175 mg/dL — ABNORMAL HIGH (ref 70–99)
Potassium: 4.2 mEq/L (ref 3.5–5.1)
Sodium: 140 mEq/L (ref 135–145)

## 2013-04-29 LAB — CREATININE, SERUM: Creatinine, Ser: 1.74 mg/dL — ABNORMAL HIGH (ref 0.50–1.35)

## 2013-04-29 MED ORDER — CEPHALEXIN 250 MG PO CAPS
250.0000 mg | ORAL_CAPSULE | Freq: Three times a day (TID) | ORAL | Status: DC
  Filled 2013-04-29 (×4): qty 1

## 2013-04-29 NOTE — Progress Notes (Signed)
Occupational Therapy Session Note  Patient Details  Name: Dylan [name] MRN: [id number] Date of Birth: 08/10/1930  Today's Date: 04/29/2013 Time: 4010-2725 Time Calculation (min): 15 min  Pt seen for skilled co-treat with SLP in [name] with focus on problem solving and safety with self-feeding. Pt required supervision with lunch meal consisting of Dys 1 textures and thin liquids.  Patient required verbal cues this session initiate self-feeding and manual facilitation to locate and attend to pudding. Pt pleasant with group members but distracted by environment with increased stimulation.   Pain: Pain Assessment Pain Assessment: No/denies pain  Therapy/Group: Group Therapy  Shivali Quackenbush 04/29/2013, 3:47 PM

## 2013-04-29 NOTE — Progress Notes (Signed)
Speech Language Pathology Daily Session Note  Patient Details  Name: Dylan [name] MRN: [id number] Date of Birth: 1930/08/18  Today's Date: 04/29/2013 Time: 1345-1430 Time Calculation (min): 45 min  Short Term Goals: Week 1: SLP Short Term Goal 1 (Week 1): STG=LTG due to length of stay  Skilled Therapeutic Interventions: SLP's goal for session was to address dysphagia goals; however, patient refused p.o.  As a result, skilled treatment session focused on addressing cognition and dysarthria goals.  SLP facilitated session with discussion regarding orientation and patient required Min verbal cues for orientation to specific place and that he had a CVA.  Patient then required Max cues to label 3 deficits following CVA.  Session also focused on structured verbal activity that compared and contrasted objects in pictures; patient required Min increased to Mod cues to accurately identify objects and Mod cues to contrast.  Cues were increased due to perseverative verbal errors.  Patient was overall Supervision for speech intelligibility today.        FIM:  Comprehension Comprehension Mode: Auditory Comprehension: 4-Understands basic 75 - 89% of the time/requires cueing 10 - 24% of the time Expression Expression Mode: Verbal Expression: 5-Expresses basic needs/ideas: With extra time/assistive device Social Interaction Social Interaction: 4-Interacts appropriately 75 - 89% of the time - Needs redirection for appropriate language or to initiate interaction. Problem Solving Problem Solving: 2-Solves basic 25 - 49% of the time - needs direction more than half the time to initiate, plan or complete simple activities Memory Memory: 1-Recognizes or recalls less than 25% of the time/requires cueing greater than 75% of the time FIM - Eating Eating Activity: 5: Supervision/cues;5: Set-up assist for open containers  Pain Pain Assessment Pain Assessment: No/denies pain  Therapy/Group: Individual  Therapy  Dylan [name]., CCC-SLP [phone]  Dylan [name] 04/29/2013, 4:42 PM

## 2013-04-29 NOTE — Progress Notes (Signed)
Patient with 1+ edema with erythema BLE--reports itching/burning-- "probably poison Ivy".  A/P1. Cellulitis: Add keflex. Compression with ace for edema control. Sarna lotion prn for itching.
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
## 2013-04-29 NOTE — Progress Notes (Signed)
Speech Language Pathology Daily Session Note  Patient Details  Name: Dylan [name] MRN: [id number] Date of Birth: 1930/07/05  Today's Date: 04/29/2013 Time: 1130-1145 Time Calculation (min): 15 min  Short Term Goals: Week 1: SLP Short Term Goal 1 (Week 1): STG=LTG due to length of stay  Skilled Therapeutic Interventions: Pt participated in co-treatment with OT in [employer] with focus on dysphagia goals. Pt consumed Dys. 1 textures with thin liquids without overt s/s of aspiration but required supervision verbal cues for small bites/sips. Pt demonstrated decreased selective attention to meal today due to pt reporting that he believed his sisters were "around the corner waiting for him." Pt was easily redirected.  Continue plan of care.    FIM:  Comprehension Comprehension Mode: Auditory Comprehension: 4-Understands basic 75 - 89% of the time/requires cueing 10 - 24% of the time Expression Expression Mode: Verbal Expression: 5-Expresses basic needs/ideas: With extra time/assistive device Social Interaction Social Interaction: 4-Interacts appropriately 75 - 89% of the time - Needs redirection for appropriate language or to initiate interaction. Problem Solving Problem Solving: 2-Solves basic 25 - 49% of the time - needs direction more than half the time to initiate, plan or complete simple activities Memory Memory: 1-Recognizes or recalls less than 25% of the time/requires cueing greater than 75% of the time FIM - Eating Eating Activity: 5: Supervision/cues;5: Set-up assist for open containers  Pain Pain Assessment Pain Assessment: No/denies pain  Therapy/Group: Group Therapy  Arsalan Brisbin 04/29/2013, 3:27 PM
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
## 2013-04-29 NOTE — Patient Care Conference (Signed)
Inpatient RehabilitationTeam Conference and Plan of Care Update Date: 04/29/2013   Time: 11;30 AM    Patient Name: Dylan [name]      Medical Record Number: [id number]  Date of Birth: 1931-02-22 Sex: Male         Room/Bed: 4W21C/4W21C-01 Payor Info: Payor: MED PAY / Plan: MED PAY ASSURANCE / Product Type: *No Product type* /    Admitting Diagnosis: RT CVA  Admit Date/Time:  04/22/2013  5:13 PM Admission Comments: No comment available   Primary Diagnosis:  <principal problem not specified> Principal Problem: <principal problem not specified>  Patient Active Problem List   Diagnosis Date Noted  . Confusion 04/13/2013  . Thoracic vertebral fracture 04/13/2013  . Diabetes 04/13/2013  . Hyponatremia 04/13/2013  . Hypokalemia 04/13/2013  . Acute on chronic renal failure 04/13/2013  . TIA (transient ischemic attack) 04/13/2013  . Alcohol abuse 04/13/2013  . CVA (cerebral infarction) 04/13/2013  . AAA (abdominal aortic aneurysm) without rupture 08/12/2012  . Fall 12/18/2011  . AAA (abdominal aortic aneurysm) 12/18/2011  . Abdominal aneurysm without mention of rupture 08/21/2011    Expected Discharge Date:    Team Members Present: Physician leading conference: Dr. Claudette Laws Social Worker Present: Dossie Der, LCSW Nurse Present: Other (comment) Ashok Cordia Reid-RN) PT Present: Edson Snowball, PT;Emily Marya Amsler, PT OT Present: Rosalio Loud, OT;Kris Gellert, Heath Lark, OT SLP Present: Fae Pippin, SLP PPS Coordinator present : Tora Duck, RN, CRRN     Current Status/Progress Goal Weekly Team Focus  Medical   severe cognitive deficits  improve orientation to place and situation  minimize sedating meds and improve sleep   Bowel/Bladder   cont of bowel and bladder; required fleets enema on 12/8 after not having a BM since 12/3, now on scheduled senna; uses the bathroom   remain cont of bowel and bladder with PO bowel meds  monitor for constipation/diarrhea    Swallow/Nutrition/ Hydration   Dys.1 textures and thin liquids   least restrictive p.o. intake  trials of Dys.2 textures   ADL's   Pt is currenlty min assist to min guard assist for selfcare and transfers.  Pt is not oriented to place, situation, or time.  Needs mod instructional cueing to follow back precautions.  Goals are set at a supervision level for all selfcare and toileting.  selfcare retraining, balance, education on back precautions and use of AE, safety   Mobility   Pt currently requires min assist for proper bed mobility techniques (is unable to remember back precautions), min/guard for sitting and standing (esp sitting due to uncontrolled descent), supervision with gait with RW and min assist for stair training  supervision overall  dynamic standing balance, gait training, safety and family education   Communication   Min-Mod assist   Min assist   incease self-monitoring and correcting    Safety/Cognition/ Behavioral Observations  Max assist  Mod assist   increase awareness and use of external aids   Pain   n/a         Skin   rash-like redness on bilateral lower legs; skin tear R hand with tegaderm  no infection or new skin breakdown while in rehab  assess skin qshift and prn; monitor redness on legs      *See Care Plan and progress notes for long and short-term goals.  Barriers to Discharge: poor carry over, no caregiver    Possible Resolutions to Barriers:  SNF placement    Discharge Planning/Teaching Needs:  Family does not feel they can  provide 24 hour care so plan on NHP      Team Discussion:  Oriented to self only-pain managed.  Balance and back precautions not aware of.  Needs 24 hr care, family can not provide.  Revisions to Treatment Plan:  Does not have 24 hr care-NHP   Continued Need for Acute Rehabilitation Level of Care: The patient requires daily medical management by a physician with specialized training in physical medicine and rehabilitation for  the following conditions: Daily direction of a multidisciplinary physical rehabilitation program to ensure safe treatment while eliciting the highest outcome that is of practical value to the patient.: Yes Daily medical management of patient stability for increased activity during participation in an intensive rehabilitation regime.: Yes Daily analysis of laboratory values and/or radiology reports with any subsequent need for medication adjustment of medical intervention for : Neurological problems;Other  Emaleigh Guimond, Lemar Livings 04/29/2013, 2:16 PM

## 2013-04-29 NOTE — Progress Notes (Signed)
Patient did not sleep last night, stayed up at the nurses station all night long stating he is "going to wait until his family comes so he can get out of here." Patient is calm, but refuses to go to bed and sleep. Will report to AM RN.

## 2013-04-29 NOTE — Progress Notes (Signed)
Physical Therapy Session Note  Patient Details  Name: Dylan [name] MRN: [id number] Date of Birth: 1931-03-02  Today's Date: 04/29/2013 Time:  -     Short Term Goals: Week 1:  PT Short Term Goal 1 (Week 1): =LTG's due to ELOS  Skilled Therapeutic Interventions/Progress Updates:   Pt received sitting in w/c at nursing station this am and agreeable to therapy.  Assisted down to therapy gym to perform ambulation without AD in order to challenge balance and increase weight shift to the L during gait.  Did not provide HHA, instead provided assist at hips to further challenge balance and note pt requires mod assist to prevent LOB and demonstrates increased lateral lean to the R.  Pt was able to tolerate >150' with mod assist.  Performed car transfer again (pt requested to "get in car") with continued mod cues for correct technique and maintaining back precautions and with safe hand placement.  Ambulated back to therapy gym as stated above to perform standing balance activity while on foam pad and cognitive task involving letter search to spell pts name.  Performed in order to work on standing tolerance, balance, sustained and alternating attention.  Note pt had extreme difficulty initially stating how to spell his name, however eventually came up with C, A, R.  Provided assist for 'L'.  Pt then unable to locate correct letters and would pull letters or even numbers stating "that looks about right" when asked if that looked correct.  Provided total assist for locating C, and then set out remainder of letters with pt able to correctly place them on magnet board in correct order.  Ended session with performing w/c mobility back to nursing station with BLEs with max verbal and demonstration cues for technique.  He required total assist to initiate movement, however once started, was able to continue to nursing station.  Left pt at nursing station with quick release belt donned for safety.     Therapy  Documentation Precautions:  Precautions Precautions: Fall;Back Precaution Comments: Pt will need constant education on back precautions, as well as family education Required Braces or Orthoses: Spinal Brace Spinal Brace: Thoracolumbosacral orthotic;Applied in sitting position Restrictions Weight Bearing Restrictions: No General:   Vital Signs:   Pain:   Mobility:   Locomotion :    Trunk/Postural Assessment :    Balance:   Exercises:   Other Treatments:    See FIM for current functional status  Therapy/Group: Individual Therapy  [name] 04/29/2013, 10:44 AM

## 2013-04-30 ENCOUNTER — Inpatient Hospital Stay (HOSPITAL_COMMUNITY): Payer: PRIVATE HEALTH INSURANCE | Admitting: Speech Pathology

## 2013-04-30 ENCOUNTER — Ambulatory Visit (HOSPITAL_COMMUNITY): Payer: PRIVATE HEALTH INSURANCE | Admitting: Speech Pathology

## 2013-04-30 ENCOUNTER — Inpatient Hospital Stay (HOSPITAL_COMMUNITY): Payer: PRIVATE HEALTH INSURANCE | Admitting: Occupational Therapy

## 2013-04-30 ENCOUNTER — Inpatient Hospital Stay (HOSPITAL_COMMUNITY): Payer: PRIVATE HEALTH INSURANCE | Admitting: Rehabilitation

## 2013-04-30 DIAGNOSIS — E1165 Type 2 diabetes mellitus with hyperglycemia: Secondary | ICD-10-CM

## 2013-04-30 DIAGNOSIS — I6330 Cerebral infarction due to thrombosis of unspecified cerebral artery: Secondary | ICD-10-CM

## 2013-04-30 DIAGNOSIS — R5381 Other malaise: Secondary | ICD-10-CM

## 2013-04-30 DIAGNOSIS — S22009A Unspecified fracture of unspecified thoracic vertebra, initial encounter for closed fracture: Secondary | ICD-10-CM

## 2013-04-30 DIAGNOSIS — I10 Essential (primary) hypertension: Secondary | ICD-10-CM

## 2013-04-30 DIAGNOSIS — G8110 Spastic hemiplegia affecting unspecified side: Secondary | ICD-10-CM

## 2013-04-30 LAB — GLUCOSE, CAPILLARY
Glucose-Capillary: 116 mg/dL — ABNORMAL HIGH (ref 70–99)
Glucose-Capillary: 130 mg/dL — ABNORMAL HIGH (ref 70–99)
Glucose-Capillary: 138 mg/dL — ABNORMAL HIGH (ref 70–99)

## 2013-04-30 NOTE — Progress Notes (Signed)
Occupational Therapy Cancellation Note  Patient Details  Name: MOSES ODOHERTY MRN: [id number] Date of Birth: 19-Jul-1930 Today's Date: 04/30/2013  Patient missed 60 minutes of skilled OT services due to refusal to get up and participate despite multiple attempts.  Noted in chart that patient did not sleep well and refused SLP session earlier.   Adelfa Lozito 04/30/2013, 9:15 AM
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
## 2013-04-30 NOTE — Progress Notes (Signed)
Nutrition Brief Note  Patient identified on the Malnutrition Screening Tool (MST) Report for unsure of any weight loss.  Wt Readings from Last 2 Encounters:  04/30/13 185 lb 13.6 oz (84.3 kg)  04/13/13 185 lb 3 oz (84 kg)   Weight has been stable throughout acute hospitalization and rehab stay.  Body mass index is 27.43 kg/(m^2). Patient meets criteria for overweight based on current BMI.   Current diet order is dysphagia 2, CHO-modified, patient is consuming approximately 75-100% of meals at this time. Labs and medications reviewed.   No nutrition interventions warranted at this time. If nutrition issues arise, please consult RD.   Joaquin Courts, RD, LDN, CNSC Pager [phone] After Hours Pager [phone]

## 2013-04-30 NOTE — Progress Notes (Signed)
Speech Language Pathology Weekly Progress Note  Patient Details  Name: Dylan [name] MRN: [id number] Date of Birth: 13-Feb-1931  Today's Date: 04/30/2013  Short Term Goals: Week 1: SLP Short Term Goal 1 (Week 1): STG=LTG due to length of stay Week 2: SLP Short Term Goal 1 (Week 2): STG=LTG   Weekly Progress Updates: Patient has been making fluctuating progress during this last reporting period due to his fluctuating cognition, sleep and overall participation.  Patient is requiring fewer cues for safety with p.o. intake and is currently trialing upgraded Dys.2 textures.  Patient continues to require Max assist for safety awareness and recall; Mod assist for communication.  As a result, he continues to require skilled SLP services to address least restrictive p.o. intake as well as to maximize safety with basic self-care tasks prior to discharge.     SLP Intensity: Minumum of 1-2 x/day, 30 to 90 minutes SLP Frequency: 5 out of 7 days SLP Duration/Estimated Length of Stay: pending SNF placement SLP Treatment/Interventions: Cognitive remediation/compensation;Cueing hierarchy;Dysphagia/aspiration precaution training;Environmental controls;Functional tasks;Patient/family education;Speech/Language facilitation;Internal/external aids;Therapeutic Activities  Dylan [name]., CCC-SLP [phone]  Dylan [name] 04/30/2013, 9:11 AM

## 2013-04-30 NOTE — Plan of Care (Signed)
Problem: RH Stairs Goal: LTG Patient will ambulate up and down stairs w/assist (PT) LTG: Patient will ambulate up and down # of stairs with assistance (PT)  Outcome: Not Applicable Date Met:  04/30/13 Will D/C stair goal due to D/C plan changed to SNF.

## 2013-04-30 NOTE — Progress Notes (Signed)
Speech Language Pathology Daily Session Note  Patient Details  Name: Dylan [name] MRN: [id number] Date of Birth: 01-04-1931  Today's Date: 04/30/2013 Time: 1130-1145 Time Calculation (min): 15 min  Short Term Goals: Week 2: SLP Short Term Goal 1 (Week 2): STG=LTG   Skilled Therapeutic Interventions: Pt participated in co-treatment with OT in diners club with focus on dysphagia goals. Pt consumed upgraded diet of Dys. 2 textures with thin liquids with cough X 1, suspect due to mixed consistencies, however, cough appeared strong enough to clear suspected aspirates. Pt required Mod verbal and question cues to self-monitor and correct left pocketing. Pt's family present throughout the meal and verbalized understanding of importance of clearing oral residue. Continue plan of care.    FIM:  Comprehension Comprehension Mode: Auditory Comprehension: 4-Understands basic 75 - 89% of the time/requires cueing 10 - 24% of the time Expression Expression Mode: Verbal Expression: 5-Expresses basic needs/ideas: With extra time/assistive device Social Interaction Social Interaction: 3-Interacts appropriately 50 - 74% of the time - May be physically or verbally inappropriate. Problem Solving Problem Solving: 2-Solves basic 25 - 49% of the time - needs direction more than half the time to initiate, plan or complete simple activities Memory Memory: 1-Recognizes or recalls less than 25% of the time/requires cueing greater than 75% of the time FIM - Eating Eating Activity: 5: Supervision/cues  Pain Pain Assessment Pain Assessment: No/denies pain  Therapy/Group: Group Therapy  Ostin Mathey 04/30/2013, 1:29 PM

## 2013-04-30 NOTE — Progress Notes (Signed)
Occupational Therapy Note  Patient Details  Name: Dylan [name] MRN: [id number] Date of Birth: [birth year] Today's Date: 04/30/2013  Time: 1145-1200 (cotx with Speech therapy-total time 1130-1200) Pt denies pain Group Therapy  Pt participated in self feeding group with focus on portion control, rate, attention to left, and checking left side of mouth for pocketed food.  Pt required mod verbal cues to check left side of mouth for food and would deny there was any pocketing.  Pt required mod verbal cues for portion control and emptying oral cavity before taking additional bites of food.    [name] [facility] 04/30/2013, 3:16 PM

## 2013-04-30 NOTE — Progress Notes (Signed)
Physical Therapy Weekly Progress Note  Patient Details  Name: Dylan [name] MRN: [id number] Date of Birth: [birth year]  Today's Date: 04/30/2013  Patient has met 1 of 7 long term goals.  Short term goals not set due to estimated length of stay.  Pt progressing well with mobility, however pt is extremely limited due to cognitive and memory deficits.  He continues to demonstrate decreased awareness or safety when performing bed mobility in order to maintain back precautions.  Note that his balance has improved, but tends to be somewhat impulsive when transferring to chair and requires min/guard to min assist at times for safety and controlled descent.  He has progressed to ambulating with RW in controlled environment at supervision level and will continue to assess gait in carpeted environment.  Pts DC plan has changed from home with daughter to SNF placement due to inability to provide appropriate amount of assist pt will need at DC.  Continue to progress towards LTGs.   Patient continues to demonstrate the following deficits: decreased balance, decreased safety, decreased awareness, decreased attention, decreased overall strength, decreased recall of precautions and therefore will continue to benefit from skilled PT intervention to enhance overall performance with activity tolerance, balance, postural control, ability to compensate for deficits, attention, awareness, coordination and knowledge of precautions.  See Patient's Care Plan for progression toward long term goals.  Patient progressing toward long term goals..  Plan of care revisions: Pt is now going to D/C to SNF due to family stating that they are unable to provide close 24/7 supervision that pt will require at D/C. Marland Kitchen  Skilled Therapeutic Interventions/Progress Updates:     See previous notes  Therapy Documentation Precautions:  Precautions Precautions: Fall;Back Precaution Comments: Pt will need constant education on back precautions,  as well as family education Required Braces or Orthoses: Spinal Brace Spinal Brace: Thoracolumbosacral orthotic;Applied in sitting position Restrictions Weight Bearing Restrictions: No   Pain: Pain Assessment Pain Assessment: No/denies pain   [name] 04/30/2013, 1:30 PM

## 2013-04-30 NOTE — Progress Notes (Signed)
SLP Cancellation Note  Patient Details Name: Dylan [name] MRN: [id number] DOB: 06-Oct-1930   Cancelled treatment:        Patient missed 45 minutes of skilled SLP services due to refusal to get up and participate despite multiple SLP attempts. Today's breakfast tray is an upgraded texture, Dys.2 and SLP dicussed this with Nurse Tech and requested that she follow up if there were concerns.   Fae Pippin, M.A., CCC-SLP [phone]  Lurae Hornbrook 04/30/2013, 8:54 AM

## 2013-05-01 ENCOUNTER — Encounter (HOSPITAL_COMMUNITY): Payer: Self-pay

## 2013-05-01 ENCOUNTER — Inpatient Hospital Stay (HOSPITAL_COMMUNITY): Payer: Self-pay | Admitting: Rehabilitation

## 2013-05-01 ENCOUNTER — Inpatient Hospital Stay (HOSPITAL_COMMUNITY): Payer: PRIVATE HEALTH INSURANCE | Admitting: Speech Pathology

## 2013-05-01 ENCOUNTER — Inpatient Hospital Stay (HOSPITAL_COMMUNITY): Payer: PRIVATE HEALTH INSURANCE | Admitting: *Deleted

## 2013-05-01 DIAGNOSIS — I6350 Cerebral infarction due to unspecified occlusion or stenosis of unspecified cerebral artery: Secondary | ICD-10-CM

## 2013-05-01 LAB — GLUCOSE, CAPILLARY
Glucose-Capillary: 119 mg/dL — ABNORMAL HIGH (ref 70–99)
Glucose-Capillary: 186 mg/dL — ABNORMAL HIGH (ref 70–99)

## 2013-05-01 NOTE — Progress Notes (Signed)
Occupational Therapy Note   Patient Details  Name: Dylan [name] MRN: [id number] Date of Birth: Dec 03, 1930 Today's Date: 05/01/2013  Time:  1000-1115  (75 min) Pain:  none Individual session  Engaged in self care training at sink level.  Pt.unable to recall back precautions.  He was disoriented to time, but knew he was at [facility].  Instructed pt in back precautions.  Pt. Stood at sink with supervision to wash periarea.  He needed total assist with donning TLSO,  Ambulated with RW to bathroom.  Stood to urinate with RW in front over toilet.  Quickly he started turning because he needed to move his bowels.  Provided minimal assist to position walker and turn.  Pt.cleaned self after BM.  Ambulated out to sink with RW.  Performed grooming standing and sitting at sink.  Taken to nursing station with safety belt on.     Humberto Seals 05/01/2013, 10:56 AM

## 2013-05-01 NOTE — Progress Notes (Signed)
Social Work Patient ID: Dylan [name], male   DOB: [birth year], 79 y.o.   MRN: [id number] Spoke with [name]-daughter to inform team conference regarding goals and FL2 out and awaiting offers.  She is particular regarding the NH's she will consider. Informed her if offers are not accepted then plan will be to go home with family or private caregiver.  He will require 24 hr care due to his confusion.  Daughter would like to have A family conference to get all of them together with MD and team to discuss his levels and needs.  Encouraged her and others to come in and attend therapies with him and Their questions could be answered then.  Daughter reports: " He wants to go home."  Informed her he will need 24 hour care.  Will check into [facility] services and see if waiting list in [facility]. Will discuss with MD and see if team can schedule a family conference.  Work on discharge plan.

## 2013-05-01 NOTE — Progress Notes (Signed)
Speech Language Pathology Daily Session Note  Patient Details  Name: Dylan [name] MRN: [id number] Date of Birth: [birth year]  Today's Date: 05/01/2013 Time: 0933-1000 Time Calculation (min): 27 min  Short Term Goals: Week 2: SLP Short Term Goal 1 (Week 2): STG=LTG   Skilled Therapeutic Interventions: Skilled treatment session focused on addressing cognitive goals.  SLP facilitated session with discussion regarding orientation and Min-Mod question cues; patient with improved topic maintiance today.  SLP also facilitated session with basic scanning task with Mod cues to sustatin attention to and recall task rule during completion.     FIM:  Comprehension Comprehension Mode: Auditory Comprehension: 4-Understands basic 75 - 89% of the time/requires cueing 10 - 24% of the time Expression Expression Mode: Verbal Expression: 5-Expresses basic needs/ideas: With extra time/assistive device Social Interaction Social Interaction: 3-Interacts appropriately 50 - 74% of the time - May be physically or verbally inappropriate. Problem Solving Problem Solving: 2-Solves basic 25 - 49% of the time - needs direction more than half the time to initiate, plan or complete simple activities Memory Memory: 1-Recognizes or recalls less than 25% of the time/requires cueing greater than 75% of the time FIM - Eating Eating Activity: 5: Supervision/cues  Pain Pain Assessment Pain Assessment: No/denies pain  Therapy/Group: Individual Therapy  Charlane Ferretti., CCC-SLP [phone]  Makinzie Considine 05/01/2013, 10:07 AM
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
## 2013-05-01 NOTE — Progress Notes (Signed)
Social Work Patient ID: Dylan [name], male   DOB: 1931/03/24, 79 y.o.   MRN: [id number] Met with daughter-[name] and granddaughter to discuss the plan.  They do want a family conference for her brother's in [place]. Have scheduled for Monday at 1;00 pt's sons to be here then.  Discussing the plan still trying to piece 24 hr care but may still end up NHP. Will continue with NHP option.

## 2013-05-01 NOTE — Plan of Care (Signed)
Problem: RH Car Transfers Goal: LTG Patient will perform car transfers with assist (PT) LTG: Patient will perform car transfers with assistance (PT).  Outcome: Not Applicable Date Met:  05/01/13 Pt will be D/Cing to SNF, therefore will transfer via ambulance to SNF.

## 2013-05-01 NOTE — Progress Notes (Signed)
Speech Language Pathology Daily Session Note  Patient Details  Name: ARDEAN MELROY MRN: [id number] Date of Birth: Mar 15, 1931  Today's Date: 05/01/2013 Time: 1130-1200 Time Calculation (min): 30 min  Short Term Goals: Week 2: SLP Short Term Goal 1 (Week 2): STG=LTG   Skilled Therapeutic Interventions: Pt participated in co-treatment with OT in [employer] club with focus on dysphagia goals. Pt consumed Dys. 2 textures with thin liquids with cough X 2, suspect due to mixed consistencies, however, cough appeared strong enough to clear suspected aspirates. Pt required Mod verbal and question cues to self-monitor and correct left pocketing and for pacing with self-feeding. Continue plan of care.    FIM:  Comprehension Comprehension Mode: Auditory Comprehension: 4-Understands basic 75 - 89% of the time/requires cueing 10 - 24% of the time Expression Expression Mode: Verbal Expression: 5-Expresses basic needs/ideas: With extra time/assistive device Social Interaction Social Interaction: 3-Interacts appropriately 50 - 74% of the time - May be physically or verbally inappropriate. Problem Solving Problem Solving: 2-Solves basic 25 - 49% of the time - needs direction more than half the time to initiate, plan or complete simple activities Memory Memory: 1-Recognizes or recalls less than 25% of the time/requires cueing greater than 75% of the time FIM - Eating Eating Activity: 5: Supervision/cues  Pain Pain Assessment Pain Assessment: No/denies pain  Therapy/Group: Group Therapy  Zaeem Kandel 05/01/2013, 1:20 PM

## 2013-05-01 NOTE — Progress Notes (Signed)
Physical Therapy Session Note  Patient Details  Name: Dylan [name] MRN: [id number] Date of Birth: 26-Jan-1931  Today's Date: 05/01/2013 Time:  -     Short Term Goals: Week 2:  PT Short Term Goal 1 (Week 2): Continue to work towards LTGs.  Pt's D/C plan changed to SNF.   Skilled Therapeutic Interventions/Progress Updates:   Pt received lying in bed this morning and in a more lucid mood than yesterday.  Agreeable to therapy and receiving meds from RN when PT arrived.  Performed supine to R SL at min assist with mod cues for log roll technique, as pt continues to have decreased recall of back precautions.  Performed SL to sit at min assist as well for same reason.  Assisted with donning shirt and provided total assist for donning brace this morning.  Ambulated approx 140' x 2 to/from gym with RW at supervision with cues for upright posture and increased stride length.  Once in gym, performed seated LAQ's BLE x 10 reps, B hamstring stretch x 30 secs x 2 reps (note L LE tighter than R).  Then performed standing OTAGO program with hip abd x 10 reps BLE, knee flex x 10 reps BLE, mini squats with max cues for maintaining straight back x 10 reps, and heel raises x 10 reps.  Note pt with increased difficulty mirroring PT demonstration (almost apraxic pattern).  Progressed to performing standing kinetron with moderate resistance x 10 mins with intermittent rest breaks in order to increase weight shifting and weight bearing through LLE, while working on strengthening of BLEs.  Performed tapping to 4" box, alternating LEs with min to moderate assist due to LOB posteriorly.  Did note improvement from previous sessions.  Ended session with BLE only nustep activity at level 5 resistance in order to increase WB, address reciprocal pattern and also strength BLEs.  Pt ambulated back to room and seated in w/c then assisted to nursing station with quick release belt donned and RN aware.    Therapy  Documentation Precautions:  Precautions Precautions: Fall;Back Precaution Comments: Pt will need constant education on back precautions, as well as family education Required Braces or Orthoses: Spinal Brace Spinal Brace: Thoracolumbosacral orthotic;Applied in sitting position Restrictions Weight Bearing Restrictions: No   Vital Signs: Therapy Vitals Temp: 98 F (36.7 C) Temp src: Axillary Pulse Rate: 75 Resp: 19 BP: 122/63 mmHg Patient Position, if appropriate: Lying Oxygen Therapy SpO2: 96 % O2 Device: None (Room air) Pain: Pain Assessment Pain Assessment: No/denies pain Pain Score: 4  Pain Type: Acute pain Pain Location: Back Pain Descriptors / Indicators: Aching Pain Frequency: Rarely Pain Onset: Gradual Patients Stated Pain Goal: 0 Pain Intervention(s): Medication (See eMAR) Multiple Pain Sites: No  See FIM for current functional status  Therapy/Group: Individual Therapy  [name] 05/01/2013, 8:47 AM

## 2013-05-01 NOTE — Progress Notes (Signed)
Occupational Therapy Note  Patient Details  Name: Dylan [name] MRN: [id number] Date of Birth: 09/27/1930 Today's Date: 05/01/2013  Time: 1200-1230 (cotx with Speech Therapy-total time 1130-1230) Pt denies pain Group Therapy  Pt participated in self feeding group with focus on portion control, rate, attention to left, and checking left side of mouth for pocketed food.  Pt required mod verbal cues to check left side of mouth for food and would deny there was any pocketing.  Pt required mod verbal cues for portion control and emptying oral cavity before taking additional bites of food.     [name] [facility] 05/01/2013, 2:56 PM

## 2013-05-02 ENCOUNTER — Inpatient Hospital Stay (HOSPITAL_COMMUNITY): Payer: PRIVATE HEALTH INSURANCE | Admitting: Speech Pathology

## 2013-05-02 ENCOUNTER — Inpatient Hospital Stay (HOSPITAL_COMMUNITY): Payer: PRIVATE HEALTH INSURANCE | Admitting: Physical Therapy

## 2013-05-02 LAB — GLUCOSE, CAPILLARY
Glucose-Capillary: 208 mg/dL — ABNORMAL HIGH (ref 70–99)
Glucose-Capillary: 145 mg/dL — ABNORMAL HIGH (ref 70–99)

## 2013-05-02 MED ORDER — QUETIAPINE FUMARATE 25 MG PO TABS
25.0000 mg | ORAL_TABLET | Freq: Every evening | ORAL | Status: DC | PRN
  Administered 2013-05-02 – 2013-05-04 (×2): 25 mg via ORAL
  Filled 2013-05-02 (×2): qty 1

## 2013-05-02 NOTE — Progress Notes (Signed)
Occupational Therapy Note  Patient Details  Name: Dylan [name] MRN: [id number] Date of Birth: 1931/03/05 Today's Date: 05/02/2013  [name] with SLP  1150-12 Co treat No c/o pain  Self feeding group with focus on following swallowing precautions,  portion control, rate, attention to left, and checking left side of mouth for pocketed food. Pt required max verbal cues to check left side of mouth for food. Pt required max verbal cues for portion control and emptying oral cavity before taking additional bites of food.   Roney Mans [facility] 05/02/2013, 1:15 PM

## 2013-05-02 NOTE — Progress Notes (Signed)
Speech Language Pathology Daily Session Note  Patient Details  Name: Dylan [name] MRN: [id number] Date of Birth: [birth year]  Today's Date: 05/02/2013 Time: 1200-1215 Time Calculation (min): 15 min  Short Term Goals: Week 2: SLP Short Term Goal 1 (Week 2): STG=LTG   Skilled Therapeutic Interventions: Therapeutic intervention completed, with co-treatment with OT  for [employer].  Therapy focused on using compensatory strategies to decrease aspiration risk.  Patient required min verbal cues to swallow prior to taking next bite.  He coughed x 3 but was able to clear and reswallow with verbal cueing as well.  Continue current treatment plan.    FIM:  FIM - Eating Eating Activity: 5: Supervision/cues  Pain Pain Assessment Pain Assessment: No/denies pain  Therapy/Group: Group Therapy  Lenny Pastel 05/02/2013, 12:49 PM

## 2013-05-02 NOTE — Progress Notes (Signed)
Physical Therapy Session Note  Patient Details  Name: Dylan [name] MRN: [id number] Date of Birth: 1930-10-18  Today's Date: 05/02/2013 Time: 7829-5621 Time Calculation (min): 44 min  Short Term Goals: Week 1:  PT Short Term Goal 1 (Week 1): =LTG's due to ELOS  Skilled Therapeutic Interventions/Progress Updates:  Pt was seen sitting up in w/c in the am. Pt transported to rehab gym, treatment focused on LE strengthening and balance. Pt performed cone taps and step ups, 2 sets x 10 reps each. Pt ambulated 150 feet with rolling walker and S to occasional min guard and verbal cues for safety. Pt required multiple verbal cues throughout treatment to preform desired tasks with limited recall noted.    Therapy Documentation Precautions:  Precautions Precautions: Fall;Back Precaution Comments: Pt will need constant education on back precautions, as well as family education Required Braces or Orthoses: Spinal Brace Spinal Brace: Thoracolumbosacral orthotic;Applied in sitting position Restrictions Weight Bearing Restrictions: No General:   Pain: No c/o pain.    Locomotion : Ambulation Ambulation/Gait Assistance: 4: Min guard;5: Supervision   See FIM for current functional status  Therapy/Group: Individual Therapy  Rayford Halsted 05/02/2013, 12:31 PM
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
## 2013-05-03 LAB — GLUCOSE, CAPILLARY
Glucose-Capillary: 118 mg/dL — ABNORMAL HIGH (ref 70–99)
Glucose-Capillary: 143 mg/dL — ABNORMAL HIGH (ref 70–99)
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
## 2013-05-04 ENCOUNTER — Inpatient Hospital Stay (HOSPITAL_COMMUNITY): Payer: PRIVATE HEALTH INSURANCE | Admitting: Speech Pathology

## 2013-05-04 ENCOUNTER — Inpatient Hospital Stay (HOSPITAL_COMMUNITY): Payer: Self-pay | Admitting: Rehabilitation

## 2013-05-04 ENCOUNTER — Encounter: Payer: Self-pay | Admitting: Vascular Surgery

## 2013-05-04 ENCOUNTER — Encounter (HOSPITAL_COMMUNITY): Payer: Self-pay | Admitting: Occupational Therapy

## 2013-05-04 ENCOUNTER — Inpatient Hospital Stay (HOSPITAL_COMMUNITY): Payer: PRIVATE HEALTH INSURANCE

## 2013-05-04 DIAGNOSIS — R5381 Other malaise: Secondary | ICD-10-CM

## 2013-05-04 DIAGNOSIS — E1165 Type 2 diabetes mellitus with hyperglycemia: Secondary | ICD-10-CM

## 2013-05-04 DIAGNOSIS — S22009A Unspecified fracture of unspecified thoracic vertebra, initial encounter for closed fracture: Secondary | ICD-10-CM

## 2013-05-04 DIAGNOSIS — I10 Essential (primary) hypertension: Secondary | ICD-10-CM

## 2013-05-04 DIAGNOSIS — G8110 Spastic hemiplegia affecting unspecified side: Secondary | ICD-10-CM

## 2013-05-04 DIAGNOSIS — I6330 Cerebral infarction due to thrombosis of unspecified cerebral artery: Secondary | ICD-10-CM

## 2013-05-04 DIAGNOSIS — IMO0001 Reserved for inherently not codable concepts without codable children: Secondary | ICD-10-CM

## 2013-05-04 LAB — GLUCOSE, CAPILLARY
Glucose-Capillary: 115 mg/dL — ABNORMAL HIGH (ref 70–99)
Glucose-Capillary: 200 mg/dL — ABNORMAL HIGH (ref 70–99)

## 2013-05-04 NOTE — Progress Notes (Signed)
Speech Language Pathology Daily Session Note  Patient Details  Name: Dylan [name] MRN: [id number] Date of Birth: 1930-10-16  Today's Date: 05/04/2013 Time: 5284-1324 Time Calculation (min): 25 min  Short Term Goals: Week 2: SLP Short Term Goal 1 (Week 2): STG=LTG   Skilled Therapeutic Interventions: Pt participated in co-treatment with OT in [employer] with focus on dysphagia goals. Pt consumed Dys. 2 textures with thin liquids with cough X 2, suspect due to decreased oral clearance resulting in possible premature spillage, however, cough appeared strong enough to clear suspected aspirates. Pt required Mod verbal and question cues to self-monitor and correct left pocketing and for pacing with self-feeding. Continue plan of care.    FIM:  Comprehension Comprehension Mode: Auditory Comprehension: 4-Understands basic 75 - 89% of the time/requires cueing 10 - 24% of the time Expression Expression Mode: Verbal Expression: 5-Expresses basic 90% of the time/requires cueing < 10% of the time. Social Interaction Social Interaction: 4-Interacts appropriately 75 - 89% of the time - Needs redirection for appropriate language or to initiate interaction. Problem Solving Problem Solving: 2-Solves basic 25 - 49% of the time - needs direction more than half the time to initiate, plan or complete simple activities Memory Memory: 1-Recognizes or recalls less than 25% of the time/requires cueing greater than 75% of the time FIM - Eating Eating Activity: 5: Needs verbal cues/supervision;4: Helper checks for pocketed food  Pain  No/denies pain  Therapy/Group: Group Therapy   Maxcine Ham, M.A. CCC-SLP [phone]   Maxcine Ham 05/04/2013, 3:51 PM

## 2013-05-04 NOTE — Progress Notes (Signed)
Occupational Therapy Discharge Summary  Patient Details  Name: Dylan [name] MRN: [id number] Date of Birth: [birth year]  Today's Date: 05/04/2013  Patient has met 6 of 11 long term goals due to improved activity tolerance, improved balance and improved attention.  Patient to discharge at overall Supervision level.  Patient's care partner unavailable to provide the necessary physical and cognitive assistance at discharge.      Reasons goals not met: Pt continues to need max instructional cueing for intellectual awareness and memory during selfcare tasks.  Pt did not attempt tub/shower transfers or simple meal prep during session.      Recommendation:  Patient will benefit from ongoing skilled OT services in skilled nursing facility setting to continue to advance functional skills in the area of BADL and iADL.  Pt continues to need supervision for safety with selfcare tasks secondary to demonstrating decreased awareness of his deficits as well as his situation.  He can perform ADLs at a supervision to min guard assist level but will benefit from ongoing OT to progress to a modified independent to independent level for return home.    Equipment: No equipment provided  Reasons for discharge: discharge from hospital  Patient/family agrees with progress made and goals achieved: Yes  OT Discharge Precautions/Restrictions  Precautions Precautions: Fall;Back Precaution Comments: Pt will need constant education on back precautions, as well as family education Required Braces or Orthoses: Spinal Brace Spinal Brace: Thoracolumbosacral orthotic;Applied in sitting position Restrictions Weight Bearing Restrictions: No  Pain Pain Assessment Pain Assessment: No/denies pain ADL  See FIM scale  Vision/Perception  Vision - History Baseline Vision: Wears glasses all the time Visual History: Glaucoma Patient Visual Report: Blurring of vision Vision - Assessment Eye Alignment: Within Functional  Limits Perception Perception: Within Functional Limits Praxis Praxis: Intact  Cognition Overall Cognitive Status: Impaired/Different from baseline Arousal/Alertness: Awake/alert Orientation Level: Oriented to person;Disoriented to place;Disoriented to time;Disoriented to situation Attention: Sustained;Focused Focused Attention: Appears intact Sustained Attention: Appears intact Selective Attention: Impaired Selective Attention Impairment: Functional basic Memory: Impaired Memory Impairment: Storage deficit;Retrieval deficit;Decreased recall of new information;Decreased long term memory;Decreased short term memory;Prospective memory Decreased Short Term Memory: Functional basic Awareness: Impaired Awareness Impairment: Intellectual impairment Problem Solving: Impaired Problem Solving Impairment: Functional basic Safety/Judgment: Impaired Comments: Pt with varialble orientation during sessions.  Consistently does not know where he is or why he is in the hospital majority of times.  Also is unaware of why he needs to wear the back brace.  Sensation Sensation Light Touch: Appears Intact Stereognosis: Appears Intact Hot/Cold: Appears Intact Proprioception: Appears Intact Coordination Gross Motor Movements are Fluid and Coordinated: Yes (in UEs) Fine Motor Movements are Fluid and Coordinated: Yes  Mobility  Transfers Transfers: Sit to Stand;Stand to Sit Sit to Stand: 5: Supervision;From chair/3-in-1;With armrests;With upper extremity assist Sit to Stand Details: Verbal cues for technique;Verbal cues for sequencing Stand to Sit: 5: Supervision;To chair/3-in-1;With armrests Stand to Sit Details (indicate cue type and reason): Verbal cues for sequencing;Verbal cues for technique  Trunk/Postural Assessment  Cervical Assessment Cervical Assessment: Within Functional Limits Thoracic Assessment Thoracic Assessment: Within Functional Limits Lumbar Assessment Lumbar Assessment: Within  Functional Limits Postural Control Postural Control: Within Functional Limits  Balance Balance Balance Assessed: Yes Static Standing Balance Static Standing - Balance Support: Bilateral upper extremity supported Static Standing - Level of Assistance: 5: Stand by assistance Dynamic Standing Balance Dynamic Standing - Balance Support: No upper extremity supported Dynamic Standing - Level of Assistance: 5: Stand by assistance Extremity/Trunk Assessment RUE Assessment  RUE Assessment: Within Functional Limits LUE Assessment LUE Assessment: Within Functional Limits  See FIM for current functional status  Jeanell Mangan OTR/L 05/04/2013, 4:04 PM

## 2013-05-04 NOTE — Plan of Care (Signed)
Problem: RH Bathing Goal: LTG Patient will bathe with assist, cues/equipment (OT) LTG: Patient will bathe specified number of body parts with assist with/without cues using equipment (position) (OT)  Outcome: Not Met (add Reason) Needs min assist for washing his feet.  Problem: RH Simple Meal Prep Goal: LTG Patient will perform simple meal prep w/assist (OT) LTG: Patient will perform simple meal prep with assistance, with/without cues (OT).  Outcome: Not Met (add Reason) Did not attempt.  Problem: RH Tub/Shower Transfers Goal: LTG Patient will perform tub/shower transfers w/assist (OT) LTG: Patient will perform tub/shower transfers with assist, with/without cues using equipment (OT)  Outcome: Not Met (add Reason) Did not address based on back brace and precautions.  Problem: RH Memory Goal: LTG Patient will demonstrate ability for day to day (OT) LTG: Patient will demonstrate ability for day to day recall/carryover during activities of daily living with assist (OT)  Outcome: Not Met (add Reason) Needs max assist for memory.  Problem: RH Awareness Goal: LTG: Patient will demonstrate intellectual/emergent (OT) LTG: Patient will demonstrate intellectual/emergent/anticipatory awareness with assist during a functional activity (OT)  Outcome: Not Met (add Reason) Pt does not demonstrate intellectual awareness.

## 2013-05-04 NOTE — Progress Notes (Signed)
Speech Language Pathology Daily Session Note & Discharge   Patient Details  Name: LUCCA BALLO MRN: [id number] Date of Birth: [birth year]  Today's Date: 05/04/2013 Time: 4098-1191 Time Calculation (min): 40 min  Short Term Goals: Week 2: SLP Short Term Goal 1 (Week 2): STG=LTG   Skilled Therapeutic Interventions: Skilled treatment session focused on addressing cognition goals.  SLP facilitated session with discussion regarding orientation and current plan of move tomorrow and more rehab.  Daughters present for discussion and engaged/re-directed patient appropriately in discussion regarding current event.  SLP also facilitated session with toileting and self-care with Supervision level physical assist and Mod verbal cues for sequencing and problem solving.     FIM:  Comprehension Comprehension Mode: Auditory Comprehension: 4-Understands basic 75 - 89% of the time/requires cueing 10 - 24% of the time Expression Expression Mode: Verbal Expression: 5-Expresses basic 90% of the time/requires cueing < 10% of the time. Social Interaction Social Interaction: 4-Interacts appropriately 75 - 89% of the time - Needs redirection for appropriate language or to initiate interaction. Problem Solving Problem Solving: 3-Solves basic 50 - 74% of the time/requires cueing 25 - 49% of the time Memory Memory: 2-Recognizes or recalls 25 - 49% of the time/requires cueing 51 - 75% of the time FIM - Eating Eating Activity: 5: Needs verbal cues/supervision;4: Helper checks for pocketed food  Pain Pain Assessment Pain Assessment: No/denies pain  Therapy/Group: Individual Therapy   Speech Language Pathology Discharge Summary  Patient Details  Name: CHAYNE BAUMGART MRN: [id number] Date of Birth: 03/22/1931  Today's Date: 05/04/2013  Patient has met 5 of 5 long term goals.  Patient to discharge at overall Min;Mod level.  Reasons goals not met: n/a   Clinical Impression/Discharge Summary: Patient met  5 out of 5 long term goals this reporting period due to diet advancement and toleration of Dys.2 textures and thin liquids with Supervision for safety with use of precautions.  Patient requires overall Min-Mod assist due to cognition: recall deficits and poor safety awareness.  Given current deficits patient is requiring 24/7 supervision and family education was initiated; however, due to severity of deficits and needs for hands on assist at times family preferred continued rehab in hopes of his cognition clearing prior to discharge home.    Care Partner:  Caregiver Able to Provide Assistance: No  Type of Caregiver Assistance: Physical;Cognitive  Recommendation:  24 hour supervision/assistance;Skilled Nursing facility  Rationale for SLP Follow Up: Maximize cognitive function and independence;Maximize swallowing safety;Reduce caregiver burden   Equipment:  none  Reasons for discharge: Treatment goals met;Discharged from hospital   Patient/Family Agrees with Progress Made and Goals Achieved: Yes   See FIM for current functional status  Charlane Ferretti., CCC-SLP [phone]  Shaquala Broeker 05/04/2013, 4:35 PM

## 2013-05-04 NOTE — Progress Notes (Signed)
Occupational Therapy Session Note  Patient Details  Name: Dylan [name] MRN: [id number] Date of Birth: [birth year]  Today's Date: 05/04/2013 Time: 1130-1150 Time Calculation (min): 20 min  Short Term Goals: Week 1:  OT Short Term Goal 1 (Week 1): Short term goals equal to long term goals set at supervision level overall based on LOS.  Skilled Therapeutic Interventions/Progress Updates:    [name] with SLP Focus on self feeding with focus on maintaining safe swallowing precautions with mod to max cuing for bite size, decr pocketing with cuing, and cuing not to over stuff mouth, attention to left, and appropriate socialization.  Therapy Documentation Precautions:  Precautions Precautions: Fall;Back Precaution Comments: Pt will need constant education on back precautions, as well as family education Required Braces or Orthoses: Spinal Brace Spinal Brace: Thoracolumbosacral orthotic;Applied in sitting position Restrictions Weight Bearing Restrictions: No Pain: No c/o pain  See FIM for current functional status  Therapy/Group: Group Therapy  Roney Mans [facility] 05/04/2013, 1:17 PM

## 2013-05-04 NOTE — Progress Notes (Signed)
Social Work Patient ID: Dylan [name], male   DOB: 1931-01-19, 79 y.o.   MRN: [id number] Message left for [name]-daughter regarding [facility] bed offer this is the facility that she would like since closer to them. Bed is available and MD feels pt is medically ready for transfer.  Will have meeting today with family and go forth with discharge plan. Await return call form daughter.

## 2013-05-04 NOTE — Progress Notes (Signed)
Occupational Therapy Session Note  Patient Details  Name: Dylan [name] MRN: [id number] Date of Birth: [birth year]  Today's Date: 05/04/2013 Time: 1002-1100 Time Calculation (min): 58 min  Short Term Goals: Week 1:  Short term goals equal to LTGs set at supervision level.  Skilled Therapeutic Interventions/Progress Updates:    Pt performed bathing and dressing sit to stand at the sink during session.  Pt unable to state place, time, or situation today.  Needed mod instructional cueing to sequence through bathing task with max demonstrational cueing to perform LB dressing secondary to needing to use AE and not being aware of his back precautions.  Pt is able to perform sit to stand during LB dressing and bathing with min guard assist.  Total assist needed to donn TLSO in sitting.  Pt ambulated to the bathroom with min guard assist as well and urinated in standing with min guard assist for balance.  Pt still confused reporting being in a different area yesterday and also leaving here to go to a funeral a few days ago as well.    Therapy Documentation Precautions:  Precautions Precautions: Fall;Back Precaution Comments: Pt will need constant education on back precautions, as well as family education Required Braces or Orthoses: Spinal Brace Spinal Brace: Thoracolumbosacral orthotic;Applied in sitting position Restrictions Weight Bearing Restrictions: No  Pain: Pain Assessment Pain Assessment: No/denies pain ADL: See FIM for current functional status  Therapy/Group: Individual Therapy  Jacoya Bauman OTR/L 05/04/2013, 1:32 PM

## 2013-05-04 NOTE — Progress Notes (Addendum)
Physical Therapy Discharge Summary  Patient Details  Name: Dylan [name] MRN: [id number] Date of Birth: October 20, 1930  Today's Date: 05/04/2013  Patient has met 4 of 7 long term goals due to improved activity tolerance, improved balance, increased strength, ability to compensate for deficits, functional use of  left upper extremity and left lower extremity and improved coordination.  Patient to discharge at an ambulatory level Supervision in controlled environment, however min assist without AD and when uncontrolled environment.  Pt to D/C to SNF, therefore did not perform any formal family training at this time.    Reasons goals not met: Pt continues to require min/guard assist for bed mobility to ensure proper log rolling technique to maintain back precautions.  He also requires min assist with dynamic standing balance (esp higher level balance activities).  He is able to perform w/c mobility in controlled environment well, however requires some assist to initiate movement in carpet or over threshold.   Recommendation:  Patient will benefit from ongoing skilled PT services in skilled nursing facility setting to continue to advance safe functional mobility, address ongoing impairments in balance, gait, safety, awareness, overall strength, and minimize fall risk.  Equipment: No equipment provided  Reasons for discharge: discharge from hospital  Patient/family agrees with progress made and goals achieved: Yes  PT Discharge Precautions/Restrictions Precautions Precautions: Fall;Back Precaution Comments: Pt will need constant education on back precautions, as well as family education Required Braces or Orthoses: Spinal Brace Spinal Brace: Thoracolumbosacral orthotic;Applied in sitting position Restrictions Weight Bearing Restrictions: No Vital Signs Therapy Vitals Temp: 98.1 F (36.7 C) Temp src: Oral Resp: 18 BP: 141/67 mmHg Patient Position, if appropriate: Lying Oxygen  Therapy SpO2: 100 % Pain Pain Assessment Pain Assessment: No/denies pain Vision/Perception  Vision - History Baseline Vision: Wears glasses all the time Visual History: Glaucoma Patient Visual Report: Blurring of vision Vision - Assessment Eye Alignment: Within Functional Limits Perception Perception: Within Functional Limits Praxis Praxis: Intact  Cognition Overall Cognitive Status: Impaired/Different from baseline Arousal/Alertness: Awake/alert Orientation Level: Oriented to person;Disoriented to place;Disoriented to time;Disoriented to situation Attention: Sustained;Focused;Selective Focused Attention: Appears intact Sustained Attention: Appears intact Selective Attention: Impaired Selective Attention Impairment: Functional basic Memory: Impaired Memory Impairment: Storage deficit;Retrieval deficit;Decreased recall of new information;Decreased long term memory;Decreased short term memory;Prospective memory Decreased Long Term Memory: Verbal basic;Functional basic Decreased Short Term Memory: Functional basic Awareness: Impaired Awareness Impairment: Intellectual impairment Problem Solving: Impaired Problem Solving Impairment: Functional basic Behaviors: Perseveration Safety/Judgment: Impaired Comments: Pt continues to demonstrate variable orientation during session and requires max verbal cues for re-orientation and to maintain back precautions and safety.  Also requires min/guard to min assist at times to prevent LOB.  Sensation Sensation Light Touch: Appears Intact Stereognosis: Appears Intact Hot/Cold: Appears Intact Proprioception: Appears Intact Coordination Gross Motor Movements are Fluid and Coordinated: No Fine Motor Movements are Fluid and Coordinated: No Coordination and Movement Description: Continues to have decreased coordinated movements during mobility, however has improved from eval.  Motor  Motor Motor: Abnormal postural alignment and  control;Hemiplegia Motor - Discharge Observations: Pt continues to have generalized weakness, slightly increased weakness in LLE vs RLE  Mobility Bed Mobility Bed Mobility: Right Sidelying to Sit;Rolling Right Rolling Right: 5: Supervision Rolling Right Details: Verbal cues for sequencing;Verbal cues for technique;Verbal cues for precautions/safety Right Sidelying to Sit: 5: Supervision Right Sidelying to Sit Details: Verbal cues for safe use of DME/AE;Verbal cues for precautions/safety;Verbal cues for technique;Verbal cues for sequencing Sit to Sidelying Right: 4: Min guard Sit to  Sidelying Right Details: Verbal cues for sequencing;Verbal cues for technique;Verbal cues for precautions/safety;Manual facilitation for placement;Manual facilitation for weight shifting Transfers Transfers: Yes Sit to Stand: 5: Supervision;From chair/3-in-1;With armrests;With upper extremity assist Sit to Stand Details: Verbal cues for technique;Verbal cues for sequencing Stand to Sit: 5: Supervision;To chair/3-in-1;With armrests Stand to Sit Details (indicate cue type and reason): Verbal cues for sequencing;Verbal cues for technique Stand Pivot Transfers: 5: Supervision Stand Pivot Transfer Details: Verbal cues for safe use of DME/AE;Verbal cues for precautions/safety;Verbal cues for technique;Verbal cues for sequencing Locomotion  Ambulation Ambulation: Yes Ambulation/Gait Assistance: 5: Supervision Ambulation Distance (Feet): 150 Feet Assistive device: Rolling walker Ambulation/Gait Assistance Details: Verbal cues for sequencing;Verbal cues for technique;Verbal cues for precautions/safety;Verbal cues for gait pattern;Verbal cues for safe use of DME/AE Gait Gait: Yes Gait Pattern: Impaired Gait Pattern: Step-through pattern;Decreased stance time - left;Decreased dorsiflexion - left;Trunk flexed;Shuffle Stairs / Additional Locomotion Stairs: No Corporate treasurer: No  Trunk/Postural  Assessment  Cervical Assessment Cervical Assessment: Within Lobbyist Thoracic Assessment: Within Functional Limits Lumbar Assessment Lumbar Assessment: Within Functional Limits Postural Control Postural Control: Within Functional Limits  Balance Balance Balance Assessed: Yes Static Standing Balance Static Standing - Balance Support: Bilateral upper extremity supported Static Standing - Level of Assistance: 5: Stand by assistance Dynamic Standing Balance Dynamic Standing - Balance Support: No upper extremity supported Dynamic Standing - Level of Assistance: 5: Stand by assistance Extremity Assessment  RUE Assessment RUE Assessment: Within Functional Limits LUE Assessment LUE Assessment: Within Functional Limits RLE Assessment RLE Assessment: Exceptions to [facility] RLE Strength RLE Overall Strength: Deficits;Due to impaired cognition RLE Overall Strength Comments: Unable to formally test due to decreased cognition, however grossly WFL during functional mobility.  LLE Assessment LLE Assessment: Exceptions to [facility] LLE Strength LLE Overall Strength: Deficits;Due to impaired cognition LLE Overall Strength Comments: Unable to formally test due to decreased cognition, however note that LLE fatigues with increased ambulation, esp at with hip flex   See FIM for current functional status  [name] 05/04/2013, 4:37 PM

## 2013-05-04 NOTE — Progress Notes (Signed)
Physical Therapy Session Note  Patient Details  Name: Dylan [name] MRN: [id number] Date of Birth: [birth year]  Today's Date: 05/04/2013 Time: 831-926 Time Calculation (min): 55 min  Short Term Goals: Week 2:  PT Short Term Goal 1 (Week 2): Continue to work towards LTGs.  Pt's D/C plan changed to SNF.   Skilled Therapeutic Interventions/Progress Updates:   Pt received coming back to nursing station, having just finished breakfast this am.  Agreeable to therapy, therefore assisted to room to get RW and was also assessed by Dr. Wynn Banker prior to therapy.  Once in gym, performed >150' gait training with RW at supervision to min/guard level.  Continue to perform gait training with use of RW for improved quality and increased endurance. Pt able to ambulate at mostly supervision, however did require intermittent min/guard when making turns, esp in busier environment.  Performed bed mobility in ADL apt to better simulate home environment and to assess/educate on proper log rolling technique to maintain back precautions.  Pt requires min assist for trunk when getting into bed to remain in SL then roll supine, however did well getting back into sitting with cues for technique at min/guard level.  Attempted standing balance exercise on foam pad to challenge balance while performing pipe tree to address cognitive, memory and problem solving skills.  Again, he is unable to start pipe tree without total assist cues.  In standing, note that pt incontinent of bowel, therefore assisted back to room in w/c and into restroom.  Pt did continue to void in toilet.  Assisted with peri care in order to maintain back precautions.  Pt able to stand at supervision level while performing peri care.  Assisted pt with donning new socks and pants/brief.  Pt stood at sink at supervision level in order to wash hands.  Cues for using soap and also throwing paper towel in trash can.  Pt assisted back to chair, donned quick release belt  and assisted to nursing station.  RN aware of bowel incontinence.     Therapy Documentation Precautions:  Precautions Precautions: Fall;Back Precaution Comments: Pt will need constant education on back precautions, as well as family education Required Braces or Orthoses: Spinal Brace Spinal Brace: Thoracolumbosacral orthotic;Applied in sitting position Restrictions Weight Bearing Restrictions: No   Pain: Pain Assessment Pain Assessment: No/denies pain  See FIM for current functional status  Therapy/Group: Individual Therapy  [name] 05/04/2013, 1:34 PM
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
## 2013-05-04 NOTE — Progress Notes (Signed)
Social Work Patient ID: Dylan [name], male   DOB: 04-Jun-1930, 79 y.o.   MRN: [id number] CONFERENCE Family Conference held with two son's, two daughter's and daughter in-law to answer questions regarding pt's recovery and functional level. All are aware he will require 24 hour close supervision in which they can not currently provide.  Discussed options and the plan is for short term NHP. Bed offer from [facility] which family prefers due to closest facility to them.  Will transfer to [facility].  Daughter has questions regarding the Medicaid and Facility taking his check.  She will follow up with [place] [employer] to figure this out, pt's daughter whom is disabled from childhood lives in the home.  Questions and concerns addressed And all felt satisfied with the result.  Hopeful he will continue to clear cognitively.
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
## 2013-05-04 NOTE — Plan of Care (Signed)
Problem: RH Balance Goal: LTG Patient will maintain dynamic standing balance (PT) LTG: Patient will maintain dynamic standing balance with assistance during mobility activities (PT)  Outcome: Not Met (add Reason) Pt continues to require min/guard to min assist for dynamic standing balance.   Problem: RH Bed Mobility Goal: LTG Patient will perform bed mobility with assist (PT) LTG: Patient will perform bed mobility with assistance, with/without cues (PT).  Outcome: Not Met (add Reason) Pt able to perform supine > sit at supervision today, however requires min/guard for safe technique when going from sit to SL>supine.   Problem: RH Ambulation Goal: LTG Patient will ambulate in controlled environment (PT) LTG: Patient will ambulate in a controlled environment, # of feet with assistance (PT).  Outcome: Completed/Met Date Met:  05/04/13 Has ambulated at supervision in controlled environment, however if in busier environment, requires min/guard assist for safety.   Problem: RH Wheelchair Mobility Goal: LTG Patient will propel w/c in home environment (PT) LTG: Patient will propel wheelchair in home environment, # of feet with assistance (PT).  Outcome: Not Met (add Reason) Pt unable to adequately use BLEs to maneuver over thresholds and carpet without assist.

## 2013-05-05 ENCOUNTER — Other Ambulatory Visit: Payer: PRIVATE HEALTH INSURANCE

## 2013-05-05 ENCOUNTER — Ambulatory Visit: Payer: PRIVATE HEALTH INSURANCE | Admitting: Vascular Surgery

## 2013-05-05 LAB — GLUCOSE, CAPILLARY
Glucose-Capillary: 147 mg/dL — ABNORMAL HIGH (ref 70–99)
Glucose-Capillary: 222 mg/dL — ABNORMAL HIGH (ref 70–99)

## 2013-05-05 MED ORDER — CITALOPRAM HYDROBROMIDE 10 MG PO TABS: 10.0000 mg | ORAL_TABLET | Freq: Every day | ORAL | Status: AC

## 2013-05-05 MED ORDER — ADULT MULTIVITAMIN W/MINERALS CH: 1.0000 | ORAL_TABLET | Freq: Every day | ORAL | Status: AC

## 2013-05-05 MED ORDER — ACETAMINOPHEN 325 MG PO TABS: 325.0000 mg | ORAL_TABLET | ORAL | Status: AC | PRN

## 2013-05-05 MED ORDER — GLIPIZIDE 5 MG PO TABS: 2.5000 mg | ORAL_TABLET | Freq: Every day | ORAL | Status: AC

## 2013-05-05 MED ORDER — SENNOSIDES-DOCUSATE SODIUM 8.6-50 MG PO TABS: 2.0000 | ORAL_TABLET | Freq: Every day | ORAL | Status: AC

## 2013-05-05 MED ORDER — QUETIAPINE FUMARATE 25 MG PO TABS: 25.0000 mg | ORAL_TABLET | Freq: Every evening | ORAL | Status: DC | PRN

## 2013-05-05 NOTE — Progress Notes (Signed)
Social Work Discharge Note Discharge Note  The overall goal for the admission was met for:   Discharge location: No-[facility]  Length of Stay: Yes-13 DAYS  Discharge activity level: Yes-SUPERVISION/MIN LEVEL  Home/community participation: Yes  Services provided included: MD, RD, PT, OT, SLP, RN, TR, Pharmacy, Neuropsych and SW  Financial Services: Medicaid and Private Insurance: [employer]-MEDICARE/[employer]  Follow-up services arranged: Other: SHORT TERM NHP  Comments (or additional information):NEEDS MORE REHAB AND TO CLEAR COGNITIVELY BEFORE FAMILY CAN TAKE HIM HOME  Patient/Family verbalized understanding of follow-up arrangements: Yes  Individual responsible for coordination of the follow-up plan: [name]-DAUGHTER  Confirmed correct DME delivered: Lucy Chris 05/05/2013    Lucy Chris
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
## 2013-05-05 NOTE — Progress Notes (Signed)
Patient discharge to SNF [facility]) via ambulance at 1256. Report call to the nurse, no further question.

## 2013-05-05 NOTE — Progress Notes (Signed)
[facility] Emergency Medical Services Physician Certication Statement   SECTION I - GENERAL INFORMATION   Patient's Name: PAXTYN BOYAR Date of Birth: [birth year] Medicare #: [id number] Transport Date: December 16,2014 (PCS is valid for round trips on this date and for all repetitive trips in the 60-day range as noted below)  Origin: [id number] Destination:[place] (Is the pt's stay covered under Medicare Part A (PPS/DRG): yes Closest appropriate facility?: yes If No, why is transport to more distant facility required?   (If hosp-hosp transfer, describe services needed at 2nd facility not available at 1st facility)   If hospice pt, is this transport related to pt's terminal illness? . no . If NO Describe:   SECTION II - MEDICAL NECESSITY QUESTIONNAIRE   Ambulance Transportation is medically necessary only if other means of transport are contraindicated or would be potentially harmful to  the patient. To meet this requirement, the patient must be either "bed confined" or suffer from a condition such that transport by means  other than ambulance is contraindicated by the patient's condition The following questions must be answered by the medical  professional signing below for this form to be valid:   1) Describe the MEDICAL CONDITION (physical and/or mental) of this patient AT THE TIME OF AMBULANCE TRANSPORT that requires  the patient to be transported in an ambulance and why transport by other means is contraindicated by the patient's condition: Back Brace and pt is confused at times.  Needs constant supervision  2) Is this patient "bed confined" as defined below? yes To be "bed confined" the patient must satisfy all three of the following conditions: (1) unable to get up from bed without Assistance; AND (2) unable to ambulate; AND (3) unable to sit in a chair or wheelchair   3)  Can this patient safely be transported by car or wheelchair van (i.e., seated during  transport, without a medical attendant or monitoring?) no .  4) In addition to completing questions 1-3 above, please check any of the following conditions that apply: Patient is confused, Medical attendant required and Orthopedic device(backboard, HALO, pins, traction, brace, wedge, etc)    .  Marland Kitchen   SECTION III - SIGNATURE OF PHYSICIAN OR HEALTHCARE PROFESSIONAL   I certify that the above information is true and correct based on my evaluation of this patient, and represent that the patient requires  transport by ambulance and that other forms of transport are contraindicated. I understand that this information will be used by the  Centers for Medicare and Medicaid Services (CMS) to support the determination of medical necessity for ambulance services, and I  represent that I have personal knowledge of the patient's condition at the time of transport.   .  If "YES" is selected: yes, I also certify that the patient is physically or mentally incapable of signing the ambulance service's claim and that the institution with which I am affiliated has furnished care, services or assistance to the patient. My signature below is made on behalf of the patient pursuant to 42 CFR 424.36(b)(4). In accordance with 42 CFR 424.37, the specific reason(s) that the patient is physically or  mentally incapable of signing the claim form is as follows:    Physician or Healthcare Professional: the patient's PCP   Signature of Physician or Healthcare Professional: Lurena Joiner Dupree_________________________  Date Signed:December [phone] (For scheduled repetitive transport, this form is not valid for  transports performed more than 60 days after this date).   Printed Name and  Credentials of Physician or Designer, fashion/clothing (MD, DO, RN, etc.) lcsw *Form must be signed only by patient's attending physician for scheduled, repetitive transports. For non-repetitive, unscheduled ambulance  transports, if unable to  obtain the signature of the attending physician, any of the following may sign (please check appropriate box below): Discharge Planner  .

## 2013-05-07 DIAGNOSIS — G8110 Spastic hemiplegia affecting unspecified side: Secondary | ICD-10-CM

## 2013-05-07 DIAGNOSIS — IMO0001 Reserved for inherently not codable concepts without codable children: Secondary | ICD-10-CM

## 2013-05-07 DIAGNOSIS — E1165 Type 2 diabetes mellitus with hyperglycemia: Secondary | ICD-10-CM

## 2013-05-07 DIAGNOSIS — R5381 Other malaise: Secondary | ICD-10-CM

## 2013-05-07 DIAGNOSIS — I10 Essential (primary) hypertension: Secondary | ICD-10-CM

## 2013-05-07 DIAGNOSIS — I6330 Cerebral infarction due to thrombosis of unspecified cerebral artery: Secondary | ICD-10-CM

## 2013-05-07 DIAGNOSIS — S22009A Unspecified fracture of unspecified thoracic vertebra, initial encounter for closed fracture: Secondary | ICD-10-CM

## 2013-05-08 ENCOUNTER — Inpatient Hospital Stay (HOSPITAL_COMMUNITY): Payer: PRIVATE HEALTH INSURANCE | Admitting: Certified Registered Nurse Anesthetist

## 2013-05-08 ENCOUNTER — Emergency Department (HOSPITAL_COMMUNITY): Payer: PRIVATE HEALTH INSURANCE

## 2013-05-08 ENCOUNTER — Inpatient Hospital Stay (HOSPITAL_COMMUNITY)
Admission: EM | Admit: 2013-05-08 | Discharge: 2013-05-21 | DRG: 025 | Disposition: E | Payer: PRIVATE HEALTH INSURANCE | Attending: Internal Medicine | Admitting: Internal Medicine

## 2013-05-08 ENCOUNTER — Encounter (HOSPITAL_COMMUNITY): Payer: Self-pay | Admitting: Emergency Medicine

## 2013-05-08 ENCOUNTER — Encounter (HOSPITAL_COMMUNITY): Admission: EM | Disposition: E | Payer: Self-pay | Source: Home / Self Care | Attending: Internal Medicine

## 2013-05-08 ENCOUNTER — Inpatient Hospital Stay: Admit: 2013-05-08 | Payer: Self-pay | Admitting: Neurosurgery

## 2013-05-08 ENCOUNTER — Encounter (HOSPITAL_COMMUNITY): Payer: PRIVATE HEALTH INSURANCE | Admitting: Certified Registered Nurse Anesthetist

## 2013-05-08 DIAGNOSIS — Z87891 Personal history of nicotine dependence: Secondary | ICD-10-CM

## 2013-05-08 DIAGNOSIS — N189 Chronic kidney disease, unspecified: Secondary | ICD-10-CM | POA: Diagnosis present

## 2013-05-08 DIAGNOSIS — R4182 Altered mental status, unspecified: Secondary | ICD-10-CM | POA: Diagnosis present

## 2013-05-08 DIAGNOSIS — F1010 Alcohol abuse, uncomplicated: Secondary | ICD-10-CM

## 2013-05-08 DIAGNOSIS — Z8673 Personal history of transient ischemic attack (TIA), and cerebral infarction without residual deficits: Secondary | ICD-10-CM

## 2013-05-08 DIAGNOSIS — N170 Acute kidney failure with tubular necrosis: Secondary | ICD-10-CM | POA: Diagnosis not present

## 2013-05-08 DIAGNOSIS — G459 Transient cerebral ischemic attack, unspecified: Secondary | ICD-10-CM | POA: Diagnosis present

## 2013-05-08 DIAGNOSIS — J4489 Other specified chronic obstructive pulmonary disease: Secondary | ICD-10-CM | POA: Diagnosis present

## 2013-05-08 DIAGNOSIS — I714 Abdominal aortic aneurysm, without rupture, unspecified: Secondary | ICD-10-CM | POA: Diagnosis present

## 2013-05-08 DIAGNOSIS — G9340 Encephalopathy, unspecified: Secondary | ICD-10-CM | POA: Diagnosis present

## 2013-05-08 DIAGNOSIS — D638 Anemia in other chronic diseases classified elsewhere: Secondary | ICD-10-CM | POA: Diagnosis present

## 2013-05-08 DIAGNOSIS — R410 Disorientation, unspecified: Secondary | ICD-10-CM

## 2013-05-08 DIAGNOSIS — J449 Chronic obstructive pulmonary disease, unspecified: Secondary | ICD-10-CM | POA: Diagnosis present

## 2013-05-08 DIAGNOSIS — S065XAA Traumatic subdural hemorrhage with loss of consciousness status unknown, initial encounter: Principal | ICD-10-CM | POA: Diagnosis present

## 2013-05-08 DIAGNOSIS — S065X9A Traumatic subdural hemorrhage with loss of consciousness of unspecified duration, initial encounter: Secondary | ICD-10-CM | POA: Diagnosis present

## 2013-05-08 DIAGNOSIS — I6200 Nontraumatic subdural hemorrhage, unspecified: Secondary | ICD-10-CM

## 2013-05-08 DIAGNOSIS — Z515 Encounter for palliative care: Secondary | ICD-10-CM

## 2013-05-08 DIAGNOSIS — I5032 Chronic diastolic (congestive) heart failure: Secondary | ICD-10-CM | POA: Diagnosis present

## 2013-05-08 DIAGNOSIS — Z6827 Body mass index (BMI) 27.0-27.9, adult: Secondary | ICD-10-CM

## 2013-05-08 DIAGNOSIS — N179 Acute kidney failure, unspecified: Secondary | ICD-10-CM

## 2013-05-08 DIAGNOSIS — E119 Type 2 diabetes mellitus without complications: Secondary | ICD-10-CM | POA: Diagnosis present

## 2013-05-08 DIAGNOSIS — E785 Hyperlipidemia, unspecified: Secondary | ICD-10-CM | POA: Diagnosis present

## 2013-05-08 DIAGNOSIS — I129 Hypertensive chronic kidney disease with stage 1 through stage 4 chronic kidney disease, or unspecified chronic kidney disease: Secondary | ICD-10-CM | POA: Diagnosis present

## 2013-05-08 DIAGNOSIS — R4020 Unspecified coma: Secondary | ICD-10-CM | POA: Diagnosis not present

## 2013-05-08 DIAGNOSIS — I639 Cerebral infarction, unspecified: Secondary | ICD-10-CM | POA: Diagnosis present

## 2013-05-08 HISTORY — PX: CRANIOTOMY: SHX93

## 2013-05-08 LAB — PROTIME-INR
INR: 1.06 (ref 0.00–1.49)
INR: 1.08 (ref 0.00–1.49)
Prothrombin Time: 13.6 seconds (ref 11.6–15.2)
Prothrombin Time: 13.8 seconds (ref 11.6–15.2)

## 2013-05-08 LAB — COMPREHENSIVE METABOLIC PANEL
ALT: 16 U/L (ref 0–53)
AST: 23 U/L (ref 0–37)
Albumin: 3.2 g/dL — ABNORMAL LOW (ref 3.5–5.2)
Alkaline Phosphatase: 75 U/L (ref 39–117)
Chloride: 106 mEq/L (ref 96–112)
GFR calc non Af Amer: 31 mL/min — ABNORMAL LOW (ref >90)
Potassium: 3.9 mEq/L (ref 3.5–5.1)
Sodium: 140 mEq/L (ref 135–145)
Total Bilirubin: 0.3 mg/dL (ref 0.3–1.2)

## 2013-05-08 LAB — GLUCOSE, CAPILLARY
Glucose-Capillary: 116 mg/dL — ABNORMAL HIGH (ref 70–99)
Glucose-Capillary: 99 mg/dL (ref 70–99)

## 2013-05-08 LAB — POCT I-STAT 3, ART BLOOD GAS (G3+)
Bicarbonate: 22.7 mEq/L (ref 20.0–24.0)
O2 Saturation: 93 %
TCO2: 24 mmol/L (ref 0–100)
pCO2 arterial: 35.9 mmHg (ref 35.0–45.0)
pH, Arterial: 7.409 (ref 7.350–7.450)

## 2013-05-08 LAB — APTT: aPTT: 28 seconds (ref 24–37)

## 2013-05-08 LAB — PLATELET INHIBITION P2Y12: Platelet Function  P2Y12: 380 [PRU] (ref 194–418)

## 2013-05-08 LAB — CBC WITH DIFFERENTIAL/PLATELET
Basophils Absolute: 0 10*3/uL (ref 0.0–0.1)
Basophils Relative: 1 % (ref 0–1)
Hemoglobin: 10.1 g/dL — ABNORMAL LOW (ref 13.0–17.0)
MCHC: 32.7 g/dL (ref 30.0–36.0)
MCV: 104 fL — ABNORMAL HIGH (ref 78.0–100.0)
Neutro Abs: 4.4 10*3/uL (ref 1.7–7.7)
Neutrophils Relative %: 64 % (ref 43–77)
Platelets: 232 10*3/uL (ref 150–400)
RDW: 13.2 % (ref 11.5–15.5)

## 2013-05-08 LAB — URINALYSIS, ROUTINE W REFLEX MICROSCOPIC
Glucose, UA: 100 mg/dL — AB
Leukocytes, UA: NEGATIVE
Protein, ur: 30 mg/dL — AB
Specific Gravity, Urine: 1.023 (ref 1.005–1.030)
Urobilinogen, UA: 0.2 mg/dL (ref 0.0–1.0)

## 2013-05-08 LAB — CG4 I-STAT (LACTIC ACID): Lactic Acid, Venous: 0.91 mmol/L (ref 0.5–2.2)

## 2013-05-08 LAB — POCT I-STAT TROPONIN I: Troponin i, poc: 0.01 ng/mL (ref 0.00–0.08)

## 2013-05-08 SURGERY — CRANIOTOMY HEMATOMA EVACUATION SUBDURAL
Anesthesia: General | Site: Head | Laterality: Bilateral

## 2013-05-08 MED ORDER — PROPOFOL 10 MG/ML IV BOLUS
INTRAVENOUS | Status: DC | PRN
  Administered 2013-05-08: 50 mg via INTRAVENOUS

## 2013-05-08 MED ORDER — INSULIN ASPART 100 UNIT/ML ~~LOC~~ SOLN
0.0000 [IU] | SUBCUTANEOUS | Status: DC
  Administered 2013-05-08: 1 [IU] via SUBCUTANEOUS
  Administered 2013-05-09 – 2013-05-11 (×11): 2 [IU] via SUBCUTANEOUS

## 2013-05-08 MED ORDER — PHENYLEPHRINE HCL 10 MG/ML IJ SOLN
10.0000 mg | INTRAMUSCULAR | Status: DC | PRN
  Administered 2013-05-08: 25 ug/min via INTRAVENOUS

## 2013-05-08 MED ORDER — BUPIVACAINE HCL (PF) 0.25 % IJ SOLN
INTRAMUSCULAR | Status: DC | PRN
  Administered 2013-05-08: 10 mL

## 2013-05-08 MED ORDER — PANTOPRAZOLE SODIUM 40 MG IV SOLR
40.0000 mg | Freq: Every day | INTRAVENOUS | Status: DC
  Administered 2013-05-08: 40 mg via INTRAVENOUS
  Filled 2013-05-08 (×2): qty 40

## 2013-05-08 MED ORDER — FENTANYL CITRATE 0.05 MG/ML IJ SOLN
INTRAMUSCULAR | Status: DC | PRN
  Administered 2013-05-08: 100 ug via INTRAVENOUS
  Administered 2013-05-08: 50 ug via INTRAVENOUS
  Administered 2013-05-08: 100 ug via INTRAVENOUS
  Administered 2013-05-09: 150 ug via INTRAVENOUS

## 2013-05-08 MED ORDER — SODIUM CHLORIDE 0.9 % IV SOLN
INTRAVENOUS | Status: DC
  Administered 2013-05-08: 18:00:00 via INTRAVENOUS

## 2013-05-08 MED ORDER — 0.9 % SODIUM CHLORIDE (POUR BTL) OPTIME
TOPICAL | Status: DC | PRN
  Administered 2013-05-08 (×2): 1000 mL

## 2013-05-08 MED ORDER — CEFAZOLIN SODIUM-DEXTROSE 2-3 GM-% IV SOLR
INTRAVENOUS | Status: AC
  Administered 2013-05-08: 2 g via INTRAVENOUS
  Filled 2013-05-08: qty 50

## 2013-05-08 MED ORDER — SODIUM CHLORIDE 0.9 % IV SOLN
250.0000 mL | INTRAVENOUS | Status: DC | PRN
  Administered 2013-05-08: 23:00:00 via INTRAVENOUS

## 2013-05-08 MED ORDER — ROCURONIUM BROMIDE 100 MG/10ML IV SOLN
INTRAVENOUS | Status: DC | PRN
  Administered 2013-05-08: 10 mg via INTRAVENOUS
  Administered 2013-05-08: 40 mg via INTRAVENOUS

## 2013-05-08 MED ORDER — THROMBIN 20000 UNITS EX SOLR
CUTANEOUS | Status: DC | PRN
  Administered 2013-05-08: via TOPICAL

## 2013-05-08 MED ORDER — LIDOCAINE-EPINEPHRINE 1 %-1:100000 IJ SOLN
INTRAMUSCULAR | Status: DC | PRN
  Administered 2013-05-08: 10 mL

## 2013-05-08 SURGICAL SUPPLY — 75 items
BANDAGE GAUZE 4  KLING STR (GAUZE/BANDAGES/DRESSINGS) IMPLANT
BANDAGE GAUZE ELAST BULKY 4 IN (GAUZE/BANDAGES/DRESSINGS) IMPLANT
BENZOIN TINCTURE PRP APPL 2/3 (GAUZE/BANDAGES/DRESSINGS) IMPLANT
BIT DRILL WIRE PASS 1.3MM (BIT) IMPLANT
BRUSH SCRUB EZ 1% IODOPHOR (MISCELLANEOUS) IMPLANT
BRUSH SCRUB EZ PLAIN DRY (MISCELLANEOUS) IMPLANT
BUR ACORN 6.0 PRECISION (BURR) ×2 IMPLANT
BUR ROUTER D-58 CRANI (BURR) ×2 IMPLANT
CANISTER SUCT 3000ML (MISCELLANEOUS) ×4 IMPLANT
CATH ROBINSON RED A/P 12FR (CATHETERS) IMPLANT
CLIP TI MEDIUM 6 (CLIP) IMPLANT
CONT SPEC 4OZ CLIKSEAL STRL BL (MISCELLANEOUS) ×2 IMPLANT
CORDS BIPOLAR (ELECTRODE) ×2 IMPLANT
DRAIN JACKSON PRATT 10MM FLAT (MISCELLANEOUS) ×4 IMPLANT
DRAIN PENROSE 1/2X12 LTX STRL (WOUND CARE) IMPLANT
DRAIN SNY WOU 7FLT (WOUND CARE) IMPLANT
DRAPE NEUROLOGICAL W/INCISE (DRAPES) ×2 IMPLANT
DRAPE SURG IRRIG POUCH 19X23 (DRAPES) IMPLANT
DRAPE WARM FLUID 44X44 (DRAPE) ×2 IMPLANT
DRESSING TELFA 8X3 (GAUZE/BANDAGES/DRESSINGS) ×2 IMPLANT
DRILL WIRE PASS 1.3MM (BIT)
DRSG OPSITE 4X5.5 SM (GAUZE/BANDAGES/DRESSINGS) IMPLANT
DRSG OPSITE POSTOP 4X6 (GAUZE/BANDAGES/DRESSINGS) ×4 IMPLANT
DURAPREP 6ML APPLICATOR 50/CS (WOUND CARE) ×2 IMPLANT
ELECT CAUTERY BLADE 6.4 (BLADE) ×2 IMPLANT
ELECT REM PT RETURN 9FT ADLT (ELECTROSURGICAL) ×2
ELECTRODE REM PT RTRN 9FT ADLT (ELECTROSURGICAL) ×1 IMPLANT
EVACUATOR SILICONE 100CC (DRAIN) ×4 IMPLANT
GAUZE SPONGE 4X4 16PLY XRAY LF (GAUZE/BANDAGES/DRESSINGS) IMPLANT
GLOVE BIO SURGEON STRL SZ8 (GLOVE) ×2 IMPLANT
GLOVE BIOGEL PI IND STRL 8 (GLOVE) ×2 IMPLANT
GLOVE BIOGEL PI IND STRL 8.5 (GLOVE) ×1 IMPLANT
GLOVE BIOGEL PI INDICATOR 8 (GLOVE) ×2
GLOVE BIOGEL PI INDICATOR 8.5 (GLOVE) ×1
GLOVE ECLIPSE 7.5 STRL STRAW (GLOVE) ×6 IMPLANT
GLOVE EXAM NITRILE LRG STRL (GLOVE) IMPLANT
GLOVE EXAM NITRILE MD LF STRL (GLOVE) IMPLANT
GLOVE EXAM NITRILE XL STR (GLOVE) IMPLANT
GLOVE EXAM NITRILE XS STR PU (GLOVE) IMPLANT
GOWN BRE IMP SLV AUR LG STRL (GOWN DISPOSABLE) ×2 IMPLANT
GOWN BRE IMP SLV AUR XL STRL (GOWN DISPOSABLE) IMPLANT
GOWN STRL REIN 2XL LVL4 (GOWN DISPOSABLE) ×4 IMPLANT
HEMOSTAT SURGICEL 2X14 (HEMOSTASIS) IMPLANT
KIT BASIN OR (CUSTOM PROCEDURE TRAY) ×2 IMPLANT
KIT ROOM TURNOVER OR (KITS) ×2 IMPLANT
NEEDLE HYPO 25X1 1.5 SAFETY (NEEDLE) ×2 IMPLANT
NS IRRIG 1000ML POUR BTL (IV SOLUTION) ×4 IMPLANT
PACK CRANIOTOMY (CUSTOM PROCEDURE TRAY) ×2 IMPLANT
PAD ABD 8X10 STRL (GAUZE/BANDAGES/DRESSINGS) IMPLANT
PAD ARMBOARD 7.5X6 YLW CONV (MISCELLANEOUS) ×4 IMPLANT
PATTIES SURGICAL .5 X.5 (GAUZE/BANDAGES/DRESSINGS) IMPLANT
PATTIES SURGICAL .5 X3 (DISPOSABLE) IMPLANT
PATTIES SURGICAL 1X1 (DISPOSABLE) IMPLANT
PIN MAYFIELD SKULL DISP (PIN) IMPLANT
PLATE 1.5  2HOLE LNG NEURO (Plate) ×6 IMPLANT
PLATE 1.5 2HOLE LNG NEURO (Plate) ×6 IMPLANT
SCREW SELF DRILL HT 1.5/4MM (Screw) ×24 IMPLANT
SPECIMEN JAR SMALL (MISCELLANEOUS) IMPLANT
SPONGE GAUZE 4X4 12PLY (GAUZE/BANDAGES/DRESSINGS) IMPLANT
SPONGE NEURO XRAY DETECT 1X3 (DISPOSABLE) IMPLANT
SPONGE SURGIFOAM ABS GEL 100 (HEMOSTASIS) ×2 IMPLANT
SPONGE SURGIFOAM ABS GEL 12-7 (HEMOSTASIS) IMPLANT
STAPLER SKIN PROX WIDE 3.9 (STAPLE) ×2 IMPLANT
SUT ETHILON 3 0 PS 1 (SUTURE) IMPLANT
SUT NURALON 4 0 TR CR/8 (SUTURE) ×4 IMPLANT
SUT VIC AB 2-0 CP2 18 (SUTURE) ×4 IMPLANT
SUT VIC AB 3-0 SH 8-18 (SUTURE) ×4 IMPLANT
SYR 20ML ECCENTRIC (SYRINGE) ×2 IMPLANT
SYR CONTROL 10ML LL (SYRINGE) ×2 IMPLANT
TOWEL OR 17X24 6PK STRL BLUE (TOWEL DISPOSABLE) ×2 IMPLANT
TOWEL OR 17X26 10 PK STRL BLUE (TOWEL DISPOSABLE) ×2 IMPLANT
TRAP SPECIMEN MUCOUS 40CC (MISCELLANEOUS) IMPLANT
TRAY FOLEY CATH 14FRSI W/METER (CATHETERS) IMPLANT
UNDERPAD 30X30 INCONTINENT (UNDERPADS AND DIAPERS) IMPLANT
WATER STERILE IRR 1000ML POUR (IV SOLUTION) ×2 IMPLANT
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
## 2013-05-08 NOTE — ED Notes (Signed)
MD at bedside. 

## 2013-05-08 NOTE — Anesthesia Procedure Notes (Signed)
Procedure Name: Intubation Date/Time: 05/16/2013 11:02 PM Performed by: Orvilla Fus A Pre-anesthesia Checklist: Patient identified, Timeout performed, Emergency Drugs available, Suction available and Patient being monitored Patient Re-evaluated:Patient Re-evaluated prior to inductionOxygen Delivery Method: Circle system utilized Preoxygenation: Pre-oxygenation with 100% oxygen Intubation Type: IV induction Ventilation: Mask ventilation without difficulty Laryngoscope Size: Mac and 4 Grade View: Grade I Tube type: Subglottic suction tube Tube size: 7.5 mm Number of attempts: 1 Airway Equipment and Method: Stylet Placement Confirmation: ETT inserted through vocal cords under direct vision,  breath sounds checked- equal and bilateral and positive ETCO2 Secured at: 22 cm Tube secured with: Tape Dental Injury: Teeth and Oropharynx as per pre-operative assessment  Comments: Suctioned inspisated material from oropharynx prior to intubation.

## 2013-05-08 NOTE — ED Notes (Signed)
Per EMS, pt coming from [facility] with increasing confusion. Nursing home reports pt was unable to sit up this morning which was not normal for him. EMS reports pt has h/o CVA and poss dementia. LKW unknown. Pt currently lying in bed, NAD. VSS.
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
## 2013-05-08 NOTE — ED Notes (Signed)
Patient transported to X-ray 

## 2013-05-08 NOTE — ED Notes (Signed)
Arlys [name], Neuro Nurse from Dr. Rush Farmer office at bedside.
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
## 2013-05-08 NOTE — Anesthesia Preprocedure Evaluation (Addendum)
Anesthesia Evaluation  Patient identified by MRN, date of birth, ID bandGeneral Assessment Comment:somnolent  Reviewed: Allergy & Precautions, H&P , NPO status , Patient's Chart, lab work & pertinent test results, Unable to perform ROS - Chart review only  History of Anesthesia Complications Negative for: history of anesthetic complications  Airway  TM Distance: >3 FB Neck ROM: Full    Dental  (+) Edentulous Upper and Edentulous Lower   Pulmonary COPDformer smoker,  breath sounds clear to auscultation  Pulmonary exam normal       Cardiovascular hypertension, Pt. on medications + Peripheral Vascular Disease (AAA stent graft, plavix) Rhythm:Regular Rate:Normal  11/14 ECHO: EF 60-65%, valves OK '13 stress test: normal LVF and perfusion, no ischemia '95 cath: no sig ASCADz   Neuro/Psych H/o ruptured cerebral aneurysm TIACVA    GI/Hepatic negative GI ROS, Neg liver ROS,   Endo/Other  diabetes (glu 119), Insulin DependentHypothyroidism   Renal/GU Renal InsufficiencyRenal disease (creat 1.95, K+ 3.9)     Musculoskeletal   Abdominal   Peds  Hematology  (+) Blood dyscrasia (Hb 10.1), anemia ,   Anesthesia Other Findings   Reproductive/Obstetrics                         Anesthesia Physical Anesthesia Plan  ASA: IV  Anesthesia Plan: General   Post-op Pain Management:    Induction: Intravenous  Airway Management Planned: Oral ETT  Additional Equipment: Arterial line  Intra-op Plan:   Post-operative Plan: Possible Post-op intubation/ventilation  Informed Consent: I have reviewed the patients History and Physical, chart, labs and discussed the procedure including the risks, benefits and alternatives for the proposed anesthesia with the patient or authorized representative who has indicated his/her understanding and acceptance.   History available from chart only  Plan Discussed with: CRNA and  Surgeon  Anesthesia Plan Comments: (Plan routine monitors, A line, GETA)        Anesthesia Quick Evaluation

## 2013-05-09 ENCOUNTER — Inpatient Hospital Stay (HOSPITAL_COMMUNITY): Payer: PRIVATE HEALTH INSURANCE

## 2013-05-09 DIAGNOSIS — I6200 Nontraumatic subdural hemorrhage, unspecified: Secondary | ICD-10-CM

## 2013-05-09 DIAGNOSIS — N189 Chronic kidney disease, unspecified: Secondary | ICD-10-CM

## 2013-05-09 DIAGNOSIS — I714 Abdominal aortic aneurysm, without rupture: Secondary | ICD-10-CM

## 2013-05-09 DIAGNOSIS — N179 Acute kidney failure, unspecified: Secondary | ICD-10-CM

## 2013-05-09 LAB — BLOOD GAS, ARTERIAL
Acid-base deficit: 6.4 mmol/L — ABNORMAL HIGH (ref 0.0–2.0)
Bicarbonate: 17.2 mEq/L — ABNORMAL LOW (ref 20.0–24.0)
Bicarbonate: 19.3 mEq/L — ABNORMAL LOW (ref 20.0–24.0)
Drawn by: 347641
Drawn by: 347641
FIO2: 0.4 %
MECHVT: 600 mL
MECHVT: 600 mL
O2 Saturation: 99.4 %
PEEP: 5 cmH2O
RATE: 16 resp/min
RATE: 12 resp/min
TCO2: 18.1 mmol/L (ref 0–100)
TCO2: 20.4 mmol/L (ref 0–100)
pCO2 arterial: 26.4 mmHg — ABNORMAL LOW (ref 35.0–45.0)
pCO2 arterial: 36.2 mmHg (ref 35.0–45.0)
pO2, Arterial: 114 mmHg — ABNORMAL HIGH (ref 80.0–100.0)
pO2, Arterial: 157 mmHg — ABNORMAL HIGH (ref 80.0–100.0)

## 2013-05-09 LAB — PREPARE PLATELET PHERESIS
Unit division: 0
Unit division: 0

## 2013-05-09 LAB — BASIC METABOLIC PANEL
BUN: 19 mg/dL (ref 6–23)
Calcium: 8.2 mg/dL — ABNORMAL LOW (ref 8.4–10.5)
Chloride: 112 mEq/L (ref 96–112)
Creatinine, Ser: 1.62 mg/dL — ABNORMAL HIGH (ref 0.50–1.35)
GFR calc Af Amer: 45 mL/min — ABNORMAL LOW (ref >90)
Sodium: 144 mEq/L (ref 135–145)

## 2013-05-09 LAB — CBC
HCT: 23.8 % — ABNORMAL LOW (ref 39.0–52.0)
MCH: 34.1 pg — ABNORMAL HIGH (ref 26.0–34.0)
MCHC: 32.8 g/dL (ref 30.0–36.0)
MCV: 103.9 fL — ABNORMAL HIGH (ref 78.0–100.0)
Platelets: 214 10*3/uL (ref 150–400)
RDW: 13.1 % (ref 11.5–15.5)
WBC: 7.8 10*3/uL (ref 4.0–10.5)

## 2013-05-09 LAB — GLUCOSE, CAPILLARY
Glucose-Capillary: 100 mg/dL — ABNORMAL HIGH (ref 70–99)
Glucose-Capillary: 123 mg/dL — ABNORMAL HIGH (ref 70–99)
Glucose-Capillary: 126 mg/dL — ABNORMAL HIGH (ref 70–99)
Glucose-Capillary: 129 mg/dL — ABNORMAL HIGH (ref 70–99)
Glucose-Capillary: 130 mg/dL — ABNORMAL HIGH (ref 70–99)
Glucose-Capillary: 146 mg/dL — ABNORMAL HIGH (ref 70–99)
Glucose-Capillary: 153 mg/dL — ABNORMAL HIGH (ref 70–99)
Glucose-Capillary: 170 mg/dL — ABNORMAL HIGH (ref 70–99)

## 2013-05-09 LAB — MAGNESIUM: Magnesium: 1.9 mg/dL (ref 1.5–2.5)

## 2013-05-09 MED ORDER — MORPHINE SULFATE 2 MG/ML IJ SOLN: 1.0000 mg | INTRAMUSCULAR | Status: DC | PRN

## 2013-05-09 MED ORDER — ADULT MULTIVITAMIN W/MINERALS CH
1.0000 | ORAL_TABLET | Freq: Every day | ORAL | Status: DC
  Administered 2013-05-09: 1 via ORAL
  Filled 2013-05-09: qty 1

## 2013-05-09 MED ORDER — FENTANYL CITRATE 0.05 MG/ML IJ SOLN: 25.0000 ug | INTRAMUSCULAR | Status: DC | PRN

## 2013-05-09 MED ORDER — SODIUM CHLORIDE 0.9 % IV SOLN
500.0000 mg | Freq: Two times a day (BID) | INTRAVENOUS | Status: DC
  Administered 2013-05-09 – 2013-05-11 (×6): 500 mg via INTRAVENOUS
  Filled 2013-05-09 (×8): qty 5

## 2013-05-09 MED ORDER — FENOFIBRATE 54 MG PO TABS
54.0000 mg | ORAL_TABLET | Freq: Every day | ORAL | Status: DC
  Administered 2013-05-09: 54 mg via ORAL
  Filled 2013-05-09: qty 1

## 2013-05-09 MED ORDER — LABETALOL HCL 5 MG/ML IV SOLN: 10.0000 mg | INTRAVENOUS | Status: DC | PRN

## 2013-05-09 MED ORDER — FENTANYL CITRATE 0.05 MG/ML IJ SOLN
50.0000 ug | INTRAMUSCULAR | Status: DC | PRN
  Administered 2013-05-10 – 2013-05-11 (×2): 50 ug via INTRAVENOUS
  Filled 2013-05-09 (×3): qty 2

## 2013-05-09 MED ORDER — BACITRACIN ZINC 500 UNIT/GM EX OINT
TOPICAL_OINTMENT | CUTANEOUS | Status: DC | PRN
  Administered 2013-05-09: 1 via TOPICAL

## 2013-05-09 MED ORDER — POLYETHYLENE GLYCOL 3350 17 G PO PACK
17.0000 g | PACK | Freq: Every day | ORAL | Status: DC | PRN
  Filled 2013-05-09: qty 1

## 2013-05-09 MED ORDER — FLEET ENEMA 7-19 GM/118ML RE ENEM
1.0000 | ENEMA | Freq: Once | RECTAL | Status: AC | PRN
  Filled 2013-05-09: qty 1

## 2013-05-09 MED ORDER — BIOTENE DRY MOUTH MT LIQD
15.0000 mL | Freq: Two times a day (BID) | OROMUCOSAL | Status: DC
  Administered 2013-05-10 (×2): 15 mL via OROMUCOSAL

## 2013-05-09 MED ORDER — GLIPIZIDE 2.5 MG HALF TABLET
2.5000 mg | ORAL_TABLET | Freq: Every day | ORAL | Status: DC
  Filled 2013-05-09 (×2): qty 1

## 2013-05-09 MED ORDER — ONDANSETRON HCL 4 MG PO TABS: 4.0000 mg | ORAL_TABLET | ORAL | Status: DC | PRN

## 2013-05-09 MED ORDER — VITAMIN B-1 100 MG PO TABS
100.0000 mg | ORAL_TABLET | Freq: Every day | ORAL | Status: DC
  Administered 2013-05-10 – 2013-05-11 (×2): 100 mg
  Filled 2013-05-09 (×2): qty 1

## 2013-05-09 MED ORDER — ATORVASTATIN CALCIUM 80 MG PO TABS
80.0000 mg | ORAL_TABLET | Freq: Every day | ORAL | Status: DC
  Administered 2013-05-10 – 2013-05-11 (×2): 80 mg
  Filled 2013-05-09 (×2): qty 1

## 2013-05-09 MED ORDER — VITAMIN B-12 1000 MCG PO TABS
2000.0000 ug | ORAL_TABLET | Freq: Every day | ORAL | Status: DC
  Administered 2013-05-10 – 2013-05-11 (×2): 2000 ug
  Filled 2013-05-09 (×2): qty 2

## 2013-05-09 MED ORDER — AMLODIPINE BESYLATE 10 MG PO TABS
10.0000 mg | ORAL_TABLET | Freq: Every morning | ORAL | Status: DC
  Administered 2013-05-09: 10 mg via ORAL
  Filled 2013-05-09: qty 1

## 2013-05-09 MED ORDER — ACETAMINOPHEN 325 MG PO TABS
650.0000 mg | ORAL_TABLET | ORAL | Status: DC | PRN
  Administered 2013-05-10 – 2013-05-11 (×3): 650 mg via ORAL
  Filled 2013-05-09 (×3): qty 2

## 2013-05-09 MED ORDER — DOCUSATE SODIUM 100 MG PO CAPS
100.0000 mg | ORAL_CAPSULE | Freq: Two times a day (BID) | ORAL | Status: DC
  Administered 2013-05-09: 100 mg via ORAL
  Filled 2013-05-09 (×3): qty 1

## 2013-05-09 MED ORDER — VITAL AF 1.2 CAL PO LIQD
1000.0000 mL | ORAL | Status: DC
  Administered 2013-05-09: 1000 mL
  Filled 2013-05-09 (×4): qty 1000

## 2013-05-09 MED ORDER — FOLIC ACID 1 MG PO TABS
1.0000 mg | ORAL_TABLET | Freq: Every day | ORAL | Status: DC
  Administered 2013-05-09: 1 mg via ORAL
  Filled 2013-05-09: qty 1

## 2013-05-09 MED ORDER — POTASSIUM CHLORIDE IN NACL 20-0.9 MEQ/L-% IV SOLN
INTRAVENOUS | Status: DC
  Administered 2013-05-09 – 2013-05-11 (×3): via INTRAVENOUS
  Filled 2013-05-09 (×6): qty 1000

## 2013-05-09 MED ORDER — LEVOTHYROXINE SODIUM 25 MCG PO TABS
25.0000 ug | ORAL_TABLET | Freq: Every day | ORAL | Status: DC
  Administered 2013-05-09: 25 ug via ORAL
  Filled 2013-05-09 (×2): qty 1

## 2013-05-09 MED ORDER — ALPRAZOLAM 0.25 MG PO TABS: 0.2500 mg | ORAL_TABLET | Freq: Every evening | ORAL | Status: DC | PRN

## 2013-05-09 MED ORDER — DROPERIDOL 2.5 MG/ML IJ SOLN
0.6250 mg | INTRAMUSCULAR | Status: DC | PRN
  Filled 2013-05-09: qty 0.25

## 2013-05-09 MED ORDER — MIDAZOLAM HCL 5 MG/5ML IJ SOLN
INTRAMUSCULAR | Status: DC | PRN
  Administered 2013-05-09: 2 mg via INTRAVENOUS

## 2013-05-09 MED ORDER — ACETAMINOPHEN 650 MG RE SUPP: 650.0000 mg | RECTAL | Status: DC | PRN

## 2013-05-09 MED ORDER — ATORVASTATIN CALCIUM 80 MG PO TABS
80.0000 mg | ORAL_TABLET | Freq: Every day | ORAL | Status: DC
  Administered 2013-05-09: 80 mg via ORAL
  Filled 2013-05-09: qty 1

## 2013-05-09 MED ORDER — ALLOPURINOL 100 MG PO TABS
100.0000 mg | ORAL_TABLET | Freq: Every day | ORAL | Status: DC
  Filled 2013-05-09 (×2): qty 1

## 2013-05-09 MED ORDER — SODIUM CHLORIDE 0.9 % IV SOLN: 250.0000 mL | INTRAVENOUS | Status: DC | PRN

## 2013-05-09 MED ORDER — PRO-STAT SUGAR FREE PO LIQD
30.0000 mL | Freq: Three times a day (TID) | ORAL | Status: DC
  Administered 2013-05-09 – 2013-05-11 (×6): 30 mL
  Filled 2013-05-09 (×8): qty 30

## 2013-05-09 MED ORDER — FOLIC ACID 1 MG PO TABS
1.0000 mg | ORAL_TABLET | Freq: Every day | ORAL | Status: DC
  Administered 2013-05-10 – 2013-05-11 (×2): 1 mg
  Filled 2013-05-09 (×2): qty 1

## 2013-05-09 MED ORDER — PANTOPRAZOLE SODIUM 40 MG IV SOLR
40.0000 mg | Freq: Every day | INTRAVENOUS | Status: DC
  Filled 2013-05-09: qty 40

## 2013-05-09 MED ORDER — VITAMIN B-1 100 MG PO TABS
100.0000 mg | ORAL_TABLET | Freq: Every day | ORAL | Status: DC
  Administered 2013-05-09: 100 mg via ORAL
  Filled 2013-05-09: qty 1

## 2013-05-09 MED ORDER — ONDANSETRON HCL 4 MG/2ML IJ SOLN: 4.0000 mg | INTRAMUSCULAR | Status: DC | PRN

## 2013-05-09 MED ORDER — VITAMIN B-12 1000 MCG PO TABS
2000.0000 ug | ORAL_TABLET | Freq: Every day | ORAL | Status: DC
  Administered 2013-05-09: 2000 ug via ORAL
  Filled 2013-05-09: qty 2

## 2013-05-09 MED ORDER — AMLODIPINE BESYLATE 10 MG PO TABS
10.0000 mg | ORAL_TABLET | Freq: Every morning | ORAL | Status: DC
  Administered 2013-05-10 – 2013-05-11 (×2): 10 mg
  Filled 2013-05-09 (×2): qty 1

## 2013-05-09 MED ORDER — CITALOPRAM HYDROBROMIDE 10 MG PO TABS
10.0000 mg | ORAL_TABLET | Freq: Every day | ORAL | Status: DC
  Administered 2013-05-09: 10 mg via ORAL
  Filled 2013-05-09: qty 1

## 2013-05-09 MED ORDER — LEVOTHYROXINE SODIUM 25 MCG PO TABS
25.0000 ug | ORAL_TABLET | Freq: Every day | ORAL | Status: DC
  Administered 2013-05-10 – 2013-05-11 (×2): 25 ug
  Filled 2013-05-09 (×3): qty 1

## 2013-05-09 MED ORDER — CHLORHEXIDINE GLUCONATE 0.12 % MT SOLN
15.0000 mL | Freq: Two times a day (BID) | OROMUCOSAL | Status: DC
  Administered 2013-05-09 – 2013-05-10 (×3): 15 mL via OROMUCOSAL
  Filled 2013-05-09 (×3): qty 15

## 2013-05-09 MED ORDER — ALLOPURINOL 100 MG PO TABS
100.0000 mg | ORAL_TABLET | Freq: Every day | ORAL | Status: DC
  Administered 2013-05-09 – 2013-05-10 (×2): 100 mg
  Filled 2013-05-09 (×3): qty 1

## 2013-05-09 MED ORDER — CITALOPRAM HYDROBROMIDE 10 MG PO TABS
10.0000 mg | ORAL_TABLET | Freq: Every day | ORAL | Status: DC
  Administered 2013-05-10 – 2013-05-11 (×2): 10 mg
  Filled 2013-05-09 (×2): qty 1

## 2013-05-09 MED ORDER — ALBUTEROL SULFATE HFA 108 (90 BASE) MCG/ACT IN AERS
2.0000 | INHALATION_SPRAY | RESPIRATORY_TRACT | Status: DC | PRN
  Filled 2013-05-09: qty 6.7

## 2013-05-09 MED ORDER — ACETAMINOPHEN 325 MG PO TABS: 325.0000 mg | ORAL_TABLET | ORAL | Status: DC | PRN

## 2013-05-09 MED ORDER — ADULT MULTIVITAMIN W/MINERALS CH
1.0000 | ORAL_TABLET | Freq: Every day | ORAL | Status: DC
  Administered 2013-05-10 – 2013-05-11 (×2): 1
  Filled 2013-05-09 (×2): qty 1

## 2013-05-09 MED ORDER — SENNA 8.6 MG PO TABS
1.0000 | ORAL_TABLET | Freq: Two times a day (BID) | ORAL | Status: DC
  Administered 2013-05-09: 8.6 mg via ORAL
  Filled 2013-05-09 (×3): qty 1

## 2013-05-09 MED ORDER — PROMETHAZINE HCL 12.5 MG PO TABS
12.5000 mg | ORAL_TABLET | ORAL | Status: DC | PRN
  Filled 2013-05-09: qty 2

## 2013-05-09 MED ORDER — FENTANYL CITRATE 0.05 MG/ML IJ SOLN
50.0000 ug | INTRAMUSCULAR | Status: AC | PRN
  Administered 2013-05-09 (×3): 50 ug via INTRAVENOUS
  Filled 2013-05-09 (×2): qty 2

## 2013-05-09 MED ORDER — SENNOSIDES-DOCUSATE SODIUM 8.6-50 MG PO TABS
2.0000 | ORAL_TABLET | Freq: Every day | ORAL | Status: DC
  Filled 2013-05-09 (×2): qty 2

## 2013-05-09 MED ORDER — BISACODYL 10 MG RE SUPP: 10.0000 mg | Freq: Every day | RECTAL | Status: DC | PRN

## 2013-05-09 MED ORDER — CEFAZOLIN SODIUM 1-5 GM-% IV SOLN
1.0000 g | Freq: Three times a day (TID) | INTRAVENOUS | Status: AC
  Administered 2013-05-09 (×2): 1 g via INTRAVENOUS
  Filled 2013-05-09 (×2): qty 50

## 2013-05-09 MED ORDER — PROPOFOL 10 MG/ML IV EMUL
5.0000 ug/kg/min | INTRAVENOUS | Status: DC
  Administered 2013-05-09: 25 ug/kg/min via INTRAVENOUS
  Filled 2013-05-09: qty 100

## 2013-05-09 NOTE — Brief Op Note (Signed)
05/05/2013 - 05/09/2013  12:37 AM  PATIENT:  Dylan [name]  79 y.o. male  PRE-OPERATIVE DIAGNOSIS:  Bilateral Subdural Hematomas  POST-OPERATIVE DIAGNOSIS:   Bilateral Subdural Hematomas  PROCEDURE:  Procedure(s): Bilateral CRANIOTOMY HEMATOMA EVACUATION SUBDURAL (Bilateral)  SURGEON:  Surgeon(s) and Role:    Maeola Harman, MD - Primary  PHYSICIAN ASSISTANT:   ASSISTANTS: none   ANESTHESIA:   general  EBL:  Total I/O In: 423 [I.V.:225; Blood:198] Out: 170 [Urine:170]  BLOOD ADMINISTERED:none and 6 pack PLTS  DRAINS: (10x2) Jackson-Pratt drain(s) with closed bulb suction in the subdural space   LOCAL MEDICATIONS USED:  LIDOCAINE   SPECIMEN:  No Specimen  DISPOSITION OF SPECIMEN:  N/A  COUNTS:  YES  TOURNIQUET:  * No tourniquets in log *  DICTATION: DICTATION: Patient is 79 year old man who has developed bilateral subdural hematomas. He has had progressively worsening headache with obtundation and CT shows bilateral subdural hematoma with mass effect. It was elected to take patient to surgery for bilateral craniotomies for SDH.  Procedure: Following smooth intubation, patient was placed in brow up position. Head was placed on donut head holder and bi-frontal scalp was shaved and prepped and draped in usual sterile fashion. Area of planned incision was infiltrated with lidocaine. A ilinear incision was made and carried through temporalis fascia and muscle to expose calvarium on each side of his head. Skull flaps was elevated exposing subdural hematoma. Dura was opened and subdural was evacuated. The subdural was larger and under greater pressure on the left. Both subdural cavities were irrigated with saline until significantly clearer. Subdural membranes were opened on both sides. Hemostasis was assured. The brain was considerably more relaxed after hematoma evacuation. Bilateral #10 JP drains were placed and anchored with vicryl sutures. Bone flaps was replaced with plates,  the fascia and galea were closed with 2-0 vicryl sutures and the skin was re approximated with staples. Sterile occlusive dressings were placed. Patient was returned to a supine position and transferred to the ICU in stable and satisfactory condition. Counts were correct at the end of the case.   PLAN OF CARE: Admit to inpatient   PATIENT DISPOSITION:  PACU - hemodynamically stable.   Delay start of Pharmacological VTE agent (>24hrs) due to surgical blood loss or risk of bleeding: yes

## 2013-05-09 NOTE — Progress Notes (Signed)
Hgb decreased to 7.8. Discussed with CCM NP Tammy.

## 2013-05-09 NOTE — Op Note (Signed)
05/13/2013 - 05/09/2013  12:37 AM  PATIENT:  Dylan [name]  79 y.o. male  PRE-OPERATIVE DIAGNOSIS:  Bilateral Subdural Hematomas  POST-OPERATIVE DIAGNOSIS:   Bilateral Subdural Hematomas  PROCEDURE:  Procedure(s): Bilateral CRANIOTOMY HEMATOMA EVACUATION SUBDURAL (Bilateral)  SURGEON:  Surgeon(s) and Role:    * Parilee Hally, MD - Primary  PHYSICIAN ASSISTANT:   ASSISTANTS: none   ANESTHESIA:   general  EBL:  Total I/O In: 423 [I.V.:225; Blood:198] Out: 170 [Urine:170]  BLOOD ADMINISTERED:none and 6 pack PLTS  DRAINS: (10x2) Jackson-Pratt drain(s) with closed bulb suction in the subdural space   LOCAL MEDICATIONS USED:  LIDOCAINE   SPECIMEN:  No Specimen  DISPOSITION OF SPECIMEN:  N/A  COUNTS:  YES  TOURNIQUET:  * No tourniquets in log *  DICTATION: DICTATION: Patient is 79 year old man who has developed bilateral subdural hematomas. He has had progressively worsening headache with obtundation and CT shows bilateral subdural hematoma with mass effect. It was elected to take patient to surgery for bilateral craniotomies for SDH.  Procedure: Following smooth intubation, patient was placed in brow up position. Head was placed on donut head holder and bi-frontal scalp was shaved and prepped and draped in usual sterile fashion. Area of planned incision was infiltrated with lidocaine. A ilinear incision was made and carried through temporalis fascia and muscle to expose calvarium on each side of his head. Skull flaps was elevated exposing subdural hematoma. Dura was opened and subdural was evacuated. The subdural was larger and under greater pressure on the left. Both subdural cavities were irrigated with saline until significantly clearer. Subdural membranes were opened on both sides. Hemostasis was assured. The brain was considerably more relaxed after hematoma evacuation. Bilateral #10 JP drains were placed and anchored with vicryl sutures. Bone flaps was replaced with plates,  the fascia and galea were closed with 2-0 vicryl sutures and the skin was re approximated with staples. Sterile occlusive dressings were placed. Patient was returned to a supine position and transferred to the ICU in stable and satisfactory condition. Counts were correct at the end of the case.   PLAN OF CARE: Admit to inpatient   PATIENT DISPOSITION:  PACU - hemodynamically stable.   Delay start of Pharmacological VTE agent (>24hrs) due to surgical blood loss or risk of bleeding: yes  
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
## 2013-05-09 NOTE — Progress Notes (Signed)
Patient ID: Dylan [name], male   DOB: 24-Jul-1930, 79 y.o.   MRN: [id number] Subjective: Patient reports comatose  Objective: Vital signs in last 24 hours: Temp:  [97.6 F (36.4 C)-99.5 F (37.5 C)] 98 F (36.7 C) (12/20 0400) Pulse Rate:  [57-104] 77 (12/20 0844) Resp:  [12-23] 21 (12/20 0844) BP: (108-159)/(46-84) 124/52 mmHg (12/20 0844) SpO2:  [96 %-100 %] 100 % (12/20 0844) FiO2 (%):  [40 %] 40 % (12/20 0844) Weight:  [80.5 kg (177 lb 7.5 oz)-83.1 kg (183 lb 3.2 oz)] 83.1 kg (183 lb 3.2 oz) (12/20 0202)  Intake/Output from previous day: 12/19 0701 - 12/20 0700 In: 2949.5 [I.V.:2584; Blood:210.5; IV Piggyback:155] Out: 630 [Urine:505; Drains:125] Intake/Output this shift:    eyes closed; grimaces a bit; minimal flicker of movement;  Lab Results:  Recent Labs  05/19/2013 1428 05/09/13 0440  WBC 6.9 7.8  HGB 10.1* 7.8*  HCT 30.9* 23.8*  PLT 232 214   BMET  Recent Labs  04/21/2013 1428 05/09/13 0440  NA 140 144  K 3.9 3.6  CL 106 112  CO2 22 20  GLUCOSE 124* 149*  BUN 23 19  CREATININE 1.95* 1.62*  CALCIUM 9.3 8.2*    Studies/Results: Dg Chest 2 View  05/15/2013   CLINICAL DATA:  COPD  EXAM: CHEST  2 VIEW  COMPARISON:  04/12/2013  FINDINGS: Cardiac shadow is at the upper limits of normal in size. The lungs are well aerated bilaterally. No focal confluent infiltrate is seen. Mild vascular congestion is noted. No acute bony abnormality is seen.  IMPRESSION: Vascular congestion without frank pulmonary edema.   Electronically Signed   By: Alcide Clever M.D.   On: 05/01/2013 15:33   Ct Head Wo Contrast  04/27/2013   CLINICAL DATA:  Increasing confusion.  EXAM: CT HEAD WITHOUT CONTRAST  TECHNIQUE: Contiguous axial images were obtained from the base of the skull through the vertex without intravenous contrast.  COMPARISON:  Brain MRI, 04/13/2013.  Head CT, 04/12/2013.  FINDINGS: Since the prior studies, the patient has developed bilateral subdural hemorrhage is. Dense  blood layers dependently creating a hematocrit effect/ fluid fluid level. On the right, 3 hemorrhage measures 16 mm in thickness just above the level of the lateral ventricles. In the same location the left subdural collection measures 25 mm in thickness.  The subdural hemorrhage is create mass effect flattening the underlying frontal and parietal lobes. The ventricles are partly effaced. There is a mild shift of the midline to the right of 3 mm.  There are no parenchymal masses. There is a well-defined old left PICA distribution infarct. Hypoattenuation in the white matter is consistent with extensive chronic microvascular ischemic change evident on the prior exam. No evidence of a recent cortical infarct.  No skull fracture. The visualized sinuses and mastoid air cells are clear.  IMPRESSION: 1. New, significant, bilateral subdural hematomas, which create significant mass effect on the underlying frontal and parietal lobes. 2. No evidence of parenchymal hemorrhage. No evidence of an acute cortical infarct.   Electronically Signed   By: Amie Portland M.D.   On: 04/24/2013 15:41   Dg Chest Port 1 View  05/09/2013   CLINICAL DATA:  Ventilator  EXAM: PORTABLE CHEST - 1 VIEW  COMPARISON:  05/11/2013  FINDINGS: Endotracheal tube tip appears to be at the level of the carina. Recommend retracting approximately 3 cm for optimal positioning. NG tube enters the stomach. Mild cardiomegaly. Bibasilar atelectasis and mild vascular congestion.  IMPRESSION: Endotracheal tube  tip appears to be near the carina. Recommend retracting approximately 3 cm.  Vascular congestion, bibasilar atelectasis.  These results were called by telephone at the time of interpretation on 05/09/2013 at 7:35 AM to patient's nurse, Verneda Skill, who verbally acknowledged these results.   Electronically Signed   By: Charlett Nose M.D.   On: 05/09/2013 07:37    Assessment/Plan: Poor prognosis after bilateral subdurals; continue supportive care    LOS: 1 day  as above   Reinaldo Meeker, MD 05/09/2013, 8:59 AM

## 2013-05-09 NOTE — Significant Event (Signed)
ABG    Component Value Date/Time   PHART 7.427 05/09/2013 0150   PCO2ART 26.4* 05/09/2013 0150   PO2ART 114.0* 05/09/2013 0150   HCO3 17.2* 05/09/2013 0150   TCO2 18.1 05/09/2013 0150   ACIDBASEDEF 6.4* 05/09/2013 0150   O2SAT 100.0 05/09/2013 0150    Will decrease RR to 12.  Coralyn Helling, MD 05/09/2013, 2:39 AM

## 2013-05-09 NOTE — Transfer of Care (Signed)
Immediate Anesthesia Transfer of Care Note  Patient: Dylan [name]  Procedure(s) Performed: Procedure(s): Bilateral CRANIOTOMY HEMATOMA EVACUATION SUBDURAL (Bilateral)  Patient Location: NICU  Anesthesia Type:General  Level of Consciousness: Patient remains intubated per anesthesia plan  Airway & Oxygen Therapy: Patient remains intubated per anesthesia plan and Patient placed on Ventilator (see vital sign flow sheet for setting)  Post-op Assessment: Report given to PACU RN and Post -op Vital signs reviewed and stable  Post vital signs: Reviewed and stable  Complications: No apparent anesthesia complications

## 2013-05-09 NOTE — Progress Notes (Signed)
Pt back from surgery. Noted right pupil ~5cm, round, and nonreactive. Left pupil ~2cm, round, and sluggish reaction.  ~0115: Dr. Venetia Maxon at bedside, discussed this nurse neuro assessment and pt course of care. Informed to continue to monitor.  ~0135: CCMD in at bedside. Updated on this nurse findings with neuro assessment, discussed pt course of care, and medications. Will continue to monitor closely.

## 2013-05-09 NOTE — Progress Notes (Signed)
Radiologist reports ETT at corina, recommends pull back 2-3cm. Discussed with RT.
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
## 2013-05-10 ENCOUNTER — Inpatient Hospital Stay (HOSPITAL_COMMUNITY): Payer: PRIVATE HEALTH INSURANCE

## 2013-05-10 DIAGNOSIS — F1010 Alcohol abuse, uncomplicated: Secondary | ICD-10-CM

## 2013-05-10 LAB — GLUCOSE, CAPILLARY
Glucose-Capillary: 160 mg/dL — ABNORMAL HIGH (ref 70–99)
Glucose-Capillary: 159 mg/dL — ABNORMAL HIGH (ref 70–99)
Glucose-Capillary: 161 mg/dL — ABNORMAL HIGH (ref 70–99)
Glucose-Capillary: 165 mg/dL — ABNORMAL HIGH (ref 70–99)
Glucose-Capillary: 153 mg/dL — ABNORMAL HIGH (ref 70–99)

## 2013-05-10 LAB — CBC
HCT: 24.6 % — ABNORMAL LOW (ref 39.0–52.0)
Hemoglobin: 7.9 g/dL — ABNORMAL LOW (ref 13.0–17.0)
MCH: 33.9 pg (ref 26.0–34.0)
MCHC: 32.1 g/dL (ref 30.0–36.0)
MCV: 105.6 fL — ABNORMAL HIGH (ref 78.0–100.0)
RDW: 13.1 % (ref 11.5–15.5)
WBC: 8 10*3/uL (ref 4.0–10.5)

## 2013-05-10 LAB — COMPREHENSIVE METABOLIC PANEL
Albumin: 2.5 g/dL — ABNORMAL LOW (ref 3.5–5.2)
Alkaline Phosphatase: 58 U/L (ref 39–117)
BUN: 18 mg/dL (ref 6–23)
Calcium: 8.3 mg/dL — ABNORMAL LOW (ref 8.4–10.5)
Creatinine, Ser: 1.33 mg/dL (ref 0.50–1.35)
GFR calc Af Amer: 57 mL/min — ABNORMAL LOW (ref >90)
Glucose, Bld: 170 mg/dL — ABNORMAL HIGH (ref 70–99)
Potassium: 3.8 mEq/L (ref 3.5–5.1)
Total Protein: 6.2 g/dL (ref 6.0–8.3)

## 2013-05-10 MED ORDER — PANTOPRAZOLE SODIUM 40 MG IV SOLR
40.0000 mg | INTRAVENOUS | Status: DC
  Administered 2013-05-10: 40 mg via INTRAVENOUS
  Filled 2013-05-10 (×3): qty 40

## 2013-05-10 NOTE — Progress Notes (Signed)
Transported patient on vent to CT scan.  Trip was uneventful.

## 2013-05-10 NOTE — Anesthesia Postprocedure Evaluation (Signed)
  Anesthesia Post-op Note  Patient: Dylan [name]  Procedure(s) Performed: Procedure(s): Bilateral CRANIOTOMY HEMATOMA EVACUATION SUBDURAL (Bilateral)  Patient Location: ICU  Anesthesia Type:General  Level of Consciousness: sedated  Airway and Oxygen Therapy: Patient remains intubated per anesthesia plan  Post-op Pain: none  Post-op Assessment: Post-op Vital signs reviewed and Patient's Cardiovascular Status Stable  Post-op Vital Signs: Reviewed and stable  Complications: No apparent anesthesia complications
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
## 2013-05-10 NOTE — Progress Notes (Signed)
Patient ID: Dylan [name], male   DOB: 1931-04-15, 79 y.o.   MRN: [id number] Afeb, vss No new neuro issues. Localizes Right more than left. Wounds clean and dry. Will recheck CT head today.

## 2013-05-11 DIAGNOSIS — I6350 Cerebral infarction due to unspecified occlusion or stenosis of unspecified cerebral artery: Secondary | ICD-10-CM

## 2013-05-11 DIAGNOSIS — F29 Unspecified psychosis not due to a substance or known physiological condition: Secondary | ICD-10-CM

## 2013-05-11 LAB — BASIC METABOLIC PANEL
BUN: 20 mg/dL (ref 6–23)
CO2: 23 mEq/L (ref 19–32)
Chloride: 113 mEq/L — ABNORMAL HIGH (ref 96–112)
GFR calc Af Amer: 57 mL/min — ABNORMAL LOW (ref >90)
GFR calc non Af Amer: 49 mL/min — ABNORMAL LOW (ref >90)
Sodium: 144 mEq/L (ref 135–145)

## 2013-05-11 LAB — CBC
Hemoglobin: 8 g/dL — ABNORMAL LOW (ref 13.0–17.0)
MCV: 105.5 fL — ABNORMAL HIGH (ref 78.0–100.0)
Platelets: 216 10*3/uL (ref 150–400)
RBC: 2.37 MIL/uL — ABNORMAL LOW (ref 4.22–5.81)
RDW: 13.1 % (ref 11.5–15.5)
WBC: 7.8 10*3/uL (ref 4.0–10.5)

## 2013-05-11 LAB — GLUCOSE, CAPILLARY
Glucose-Capillary: 166 mg/dL — ABNORMAL HIGH (ref 70–99)
Glucose-Capillary: 151 mg/dL — ABNORMAL HIGH (ref 70–99)

## 2013-05-11 LAB — PLATELET FUNCTION ASSAY: Collagen / Epinephrine: 100 seconds (ref 0–184)

## 2013-05-11 MED ORDER — MORPHINE SULFATE 10 MG/ML IJ SOLN
10.0000 mg/h | INTRAVENOUS | Status: DC
  Administered 2013-05-11 (×2): 10 mg/h via INTRAVENOUS
  Filled 2013-05-11 (×3): qty 10

## 2013-05-11 MED ORDER — BIOTENE DRY MOUTH MT LIQD
15.0000 mL | Freq: Four times a day (QID) | OROMUCOSAL | Status: DC
  Administered 2013-05-11 (×3): 15 mL via OROMUCOSAL

## 2013-05-11 MED ORDER — WHITE PETROLATUM GEL
Status: AC
  Filled 2013-05-11: qty 5

## 2013-05-11 MED ORDER — MORPHINE BOLUS VIA INFUSION
5.0000 mg | INTRAVENOUS | Status: DC | PRN
  Filled 2013-05-11: qty 20

## 2013-05-11 MED ORDER — CHLORHEXIDINE GLUCONATE 0.12 % MT SOLN
15.0000 mL | Freq: Two times a day (BID) | OROMUCOSAL | Status: DC
  Administered 2013-05-11: 15 mL via OROMUCOSAL
  Filled 2013-05-11: qty 15

## 2013-05-11 NOTE — Plan of Care (Signed)
Problem: Consults Goal: Diagnosis - Craniotomy Subdural hematoma bilaterally

## 2013-05-11 NOTE — Clinical Social Work Note (Signed)
Clinical Social Worker received phone call from [facility] stating that patient is a current resident and they are requesting admitting diagnosis.  CSW confirmed with supervisor that information could be provided.  CSW understands that patient family has opted for comfort care.  CSW remains available for support to patient family as needed.  Macario Golds, [place] [phone]

## 2013-05-11 NOTE — Progress Notes (Signed)
lack of progress, clear cut prior wishes to not continue Extensive discussion with family daughter. We discussed the poor prognosis and likely poor quality of life. Family has decided to offer full comfort care. They are aware that the patient may be transferred to palliative care floor for continued comfort care needs. They have been fully updated on the process and expectations.  Will update NS Mcarthur Rossetti. Tyson Alias, MD, FACP Pgr: [phone] [facility]

## 2013-05-11 NOTE — Progress Notes (Signed)
RT NOTE: Patient terminally extubated per MD order to room air for comfort care. Patient resting comfortably. Family at bedside post extubation.

## 2013-05-11 NOTE — Progress Notes (Signed)
Subjective: Patient reports on vent  Objective: Vital signs in last 24 hours: Temp:  [99 F (37.2 C)-100.5 F (38.1 C)] 99.5 F (37.5 C) (12/22 0400) Pulse Rate:  [69-101] 99 (12/22 0725) Resp:  [12-22] 20 (12/22 0725) BP: (111-178)/(54-95) 152/62 mmHg (12/22 0725) SpO2:  [100 %] 100 % (12/22 0725) FiO2 (%):  [30 %-40 %] 30 % (12/22 0725) Weight:  [86.8 kg (191 lb 5.8 oz)] 86.8 kg (191 lb 5.8 oz) (12/22 0316)  Intake/Output from previous day: 12/21 0701 - 12/22 0700 In: 3095 [I.V.:1800; NG/GT:1190; IV Piggyback:105] Out: 1705 [Urine:1500; Drains:205] Intake/Output this shift:    Physical Exam: Localizes right more than left.  Dysconjugate gaze, PERRL. Not following commands.  Lab Results:  Recent Labs  05/10/13 0412 05/11/13 0345  WBC 8.0 7.8  HGB 7.9* 8.0*  HCT 24.6* 25.0*  PLT 228 216   BMET  Recent Labs  05/10/13 0412 05/11/13 0345  NA 146* 144  K 3.8 4.0  CL 114* 113*  CO2 22 23  GLUCOSE 170* 181*  BUN 18 20  CREATININE 1.33 1.32  CALCIUM 8.3* 8.2*    Studies/Results: Ct Head Wo Contrast  05/10/2013   CLINICAL DATA:  Status post drainage of bilateral subdural hematomas. The patient is not waking up.  EXAM: CT HEAD WITHOUT CONTRAST  TECHNIQUE: Contiguous axial images were obtained from the base of the skull through the vertex without intravenous contrast.  COMPARISON:  CT head without contrast 2013/05/23.  FINDINGS: The patient is status post bilateral craniotomies for evacuation of the subdural hemorrhages. Drains are now in place bilaterally. Low density extra-axial fluid persist over the left convexity, markedly decreased from the prior exam. There is very little high-density of blood. There is persistent or possibly recurrent high-density blood over the right convexity. The patient is scanned at a slight angle. Accounting for this angulation, the extra-axial collections remain symmetric.  Diffuse white matter disease is similar to the prior exam. No new  cortical infarcts are evident. Basal ganglia appear to be intact.  No parenchymal subarachnoid hemorrhage is evident. There is some blood along the falx. Minimal left to right midline shift is present. Pneumocephalus is as expected.  The paranasal sinuses are clear. The osseous skull is intact apart from craniotomies.  IMPRESSION: 1. Interval drainage of bilateral subdural hematoma is. 2. Significant decrease in the extra-axial collections bilaterally with some residual symmetric fluid. 3. High-density material on the right suggests residual or potentially recurrent hemorrhage with minimal right to left midline shift. 4. Bilateral drains are in place. 5. Pneumocephalus is as expected.   Electronically Signed   By: Gennette Pac M.D.   On: 05/10/2013 10:31   Dg Chest Port 1 View  05/10/2013   CLINICAL DATA:  Intubated.  EXAM: PORTABLE CHEST - 1 VIEW  COMPARISON:  05/09/2013  FINDINGS: Support devices are stable. Heart is borderline in size. No confluent opacities or overt edema. No visible effusions or acute bony abnormality.  IMPRESSION: No confluent opacities or edema currently.  Borderline heart size.   Electronically Signed   By: Charlett Nose M.D.   On: 05/10/2013 08:31    Assessment/Plan: Despite improved head CT with decreased size of subdural collections, patient has not improved neurologically a great deal.  Per family discussion preoperatively, consideration should be made to extubation and supportive care.    LOS: 3 days    Dorian Heckle, MD 05/11/2013, 8:35 AM
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
## 2013-05-12 DIAGNOSIS — Z515 Encounter for palliative care: Secondary | ICD-10-CM

## 2013-05-12 LAB — TYPE AND SCREEN
ABO/RH(D): A POS
Antibody Screen: NEGATIVE
Unit division: 0

## 2013-05-13 ENCOUNTER — Encounter (HOSPITAL_COMMUNITY): Payer: Self-pay | Admitting: Neurosurgery
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
## 2013-05-21 NOTE — Progress Notes (Signed)
Medical examiner Rick Miller,notified. Case declined.

## 2013-05-21 NOTE — Progress Notes (Signed)
Nutrition Brief Note  Chart reviewed. Pt terminally extubated and now transitioning to comfort care.  No further nutrition interventions warranted at this time.  Please re-consult as needed.   Loyce Dys, MS RD LDN Clinical Inpatient Dietitian Pager: [phone] Weekend/After hours pager: [phone]
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
## 2013-05-21 NOTE — Progress Notes (Signed)
Chaplain met with family of pt who was actively dying.  Chaplain spent some time with them and then shared in prayer.  Family seemed grateful for the visit.

## 2013-05-21 NOTE — Progress Notes (Signed)
[facility] informed. Referral # [id number]. Representative Erasmo Score

## 2013-05-21 NOTE — Progress Notes (Signed)
Pt pronounced dead at 1208 with RN and MD.

## 2013-05-21 DEATH — deceased

## 2013-06-15 ENCOUNTER — Inpatient Hospital Stay: Payer: Self-pay | Admitting: Physical Medicine & Rehabilitation

## 2014-04-08 IMAGING — CR DG CHEST 1V PORT
1 series · 1 of 1 positions shown · non-contrast
Comparison: 05/08/2013

CLINICAL DATA: Ventilator

EXAM:
PORTABLE CHEST - 1 VIEW

[AP]
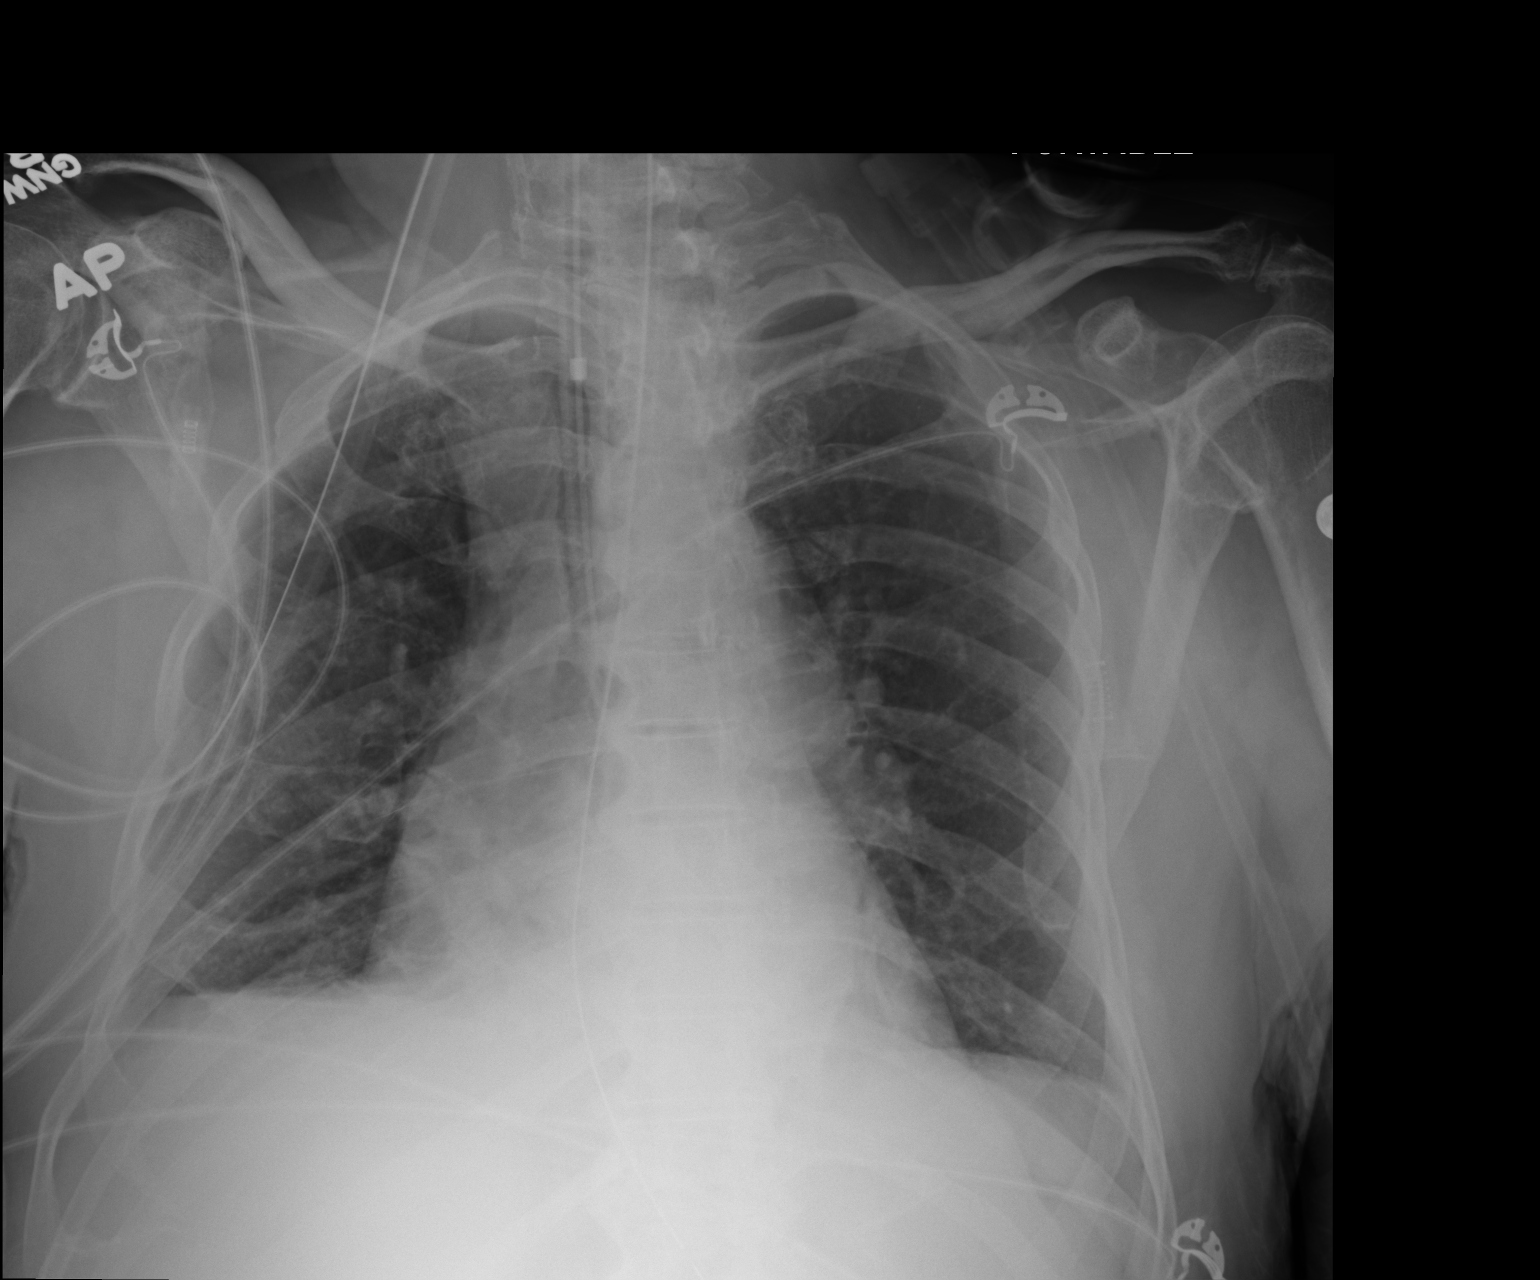

[1 of 1 positions shown; findings below may reference images not displayed]

FINDINGS: Endotracheal tube tip appears to be at the level of the carina.
Recommend retracting approximately 3 cm for optimal positioning. NG
tube enters the stomach. Mild cardiomegaly. Bibasilar atelectasis
and mild vascular congestion.
IMPRESSION: Endotracheal tube tip appears to be near the carina. Recommend
retracting approximately 3 cm.

Vascular congestion, bibasilar atelectasis.

These results were called by telephone at the time of interpretation
on 05/09/2013 at [DATE] to patient's nurse, Richar Bart, who
verbally acknowledged these results.
